# Patient Record
Sex: Male | Born: 1955 | Race: White | Hispanic: No | Marital: Married | Smoking: Never smoker
Health system: Southern US, Community
[De-identification: ages and names within clinical notes are randomized; demographics above are authoritative.]

## PROBLEM LIST (undated history)

## (undated) DIAGNOSIS — K5790 Diverticulosis of intestine, part unspecified, without perforation or abscess without bleeding: Secondary | ICD-10-CM

## (undated) DIAGNOSIS — K219 Gastro-esophageal reflux disease without esophagitis: Secondary | ICD-10-CM

## (undated) DIAGNOSIS — I10 Essential (primary) hypertension: Secondary | ICD-10-CM

## (undated) DIAGNOSIS — E785 Hyperlipidemia, unspecified: Secondary | ICD-10-CM

## (undated) DIAGNOSIS — K529 Noninfective gastroenteritis and colitis, unspecified: Secondary | ICD-10-CM

## (undated) DIAGNOSIS — F329 Major depressive disorder, single episode, unspecified: Secondary | ICD-10-CM

## (undated) DIAGNOSIS — F32A Depression, unspecified: Secondary | ICD-10-CM

## (undated) DIAGNOSIS — E039 Hypothyroidism, unspecified: Secondary | ICD-10-CM

## (undated) HISTORY — PX: CHOLECYSTECTOMY: SHX55

## (undated) HISTORY — PX: COLONOSCOPY: SHX174

## (undated) HISTORY — PX: OTHER SURGICAL HISTORY: SHX169

---
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
## 2019-06-01 ENCOUNTER — Encounter (HOSPITAL_COMMUNITY): Payer: Self-pay | Admitting: Internal Medicine

## 2019-06-01 ENCOUNTER — Inpatient Hospital Stay (HOSPITAL_COMMUNITY): Payer: Commercial Managed Care - PPO

## 2019-06-01 ENCOUNTER — Inpatient Hospital Stay: Payer: Self-pay

## 2019-06-01 ENCOUNTER — Inpatient Hospital Stay (HOSPITAL_COMMUNITY)
Admission: AD | Admit: 2019-06-01 | Discharge: 2019-06-13 | DRG: 071 | Disposition: A | Discharge status: Other Acute Inpatient Hospital | Payer: Commercial Managed Care - PPO | Source: Other Acute Inpatient Hospital | Attending: Internal Medicine | Admitting: Internal Medicine

## 2019-06-01 DIAGNOSIS — F23 Brief psychotic disorder: Secondary | ICD-10-CM | POA: Diagnosis present

## 2019-06-01 DIAGNOSIS — F329 Major depressive disorder, single episode, unspecified: Secondary | ICD-10-CM | POA: Diagnosis present

## 2019-06-01 DIAGNOSIS — R739 Hyperglycemia, unspecified: Secondary | ICD-10-CM | POA: Diagnosis present

## 2019-06-01 DIAGNOSIS — Z8249 Family history of ischemic heart disease and other diseases of the circulatory system: Secondary | ICD-10-CM

## 2019-06-01 DIAGNOSIS — G9341 Metabolic encephalopathy: Principal | ICD-10-CM | POA: Diagnosis present

## 2019-06-01 DIAGNOSIS — Z9049 Acquired absence of other specified parts of digestive tract: Secondary | ICD-10-CM

## 2019-06-01 DIAGNOSIS — F32A Depression, unspecified: Secondary | ICD-10-CM | POA: Diagnosis present

## 2019-06-01 DIAGNOSIS — Z79890 Hormone replacement therapy (postmenopausal): Secondary | ICD-10-CM | POA: Diagnosis not present

## 2019-06-01 DIAGNOSIS — R0902 Hypoxemia: Secondary | ICD-10-CM

## 2019-06-01 DIAGNOSIS — E162 Hypoglycemia, unspecified: Secondary | ICD-10-CM | POA: Diagnosis not present

## 2019-06-01 DIAGNOSIS — E876 Hypokalemia: Secondary | ICD-10-CM | POA: Diagnosis present

## 2019-06-01 DIAGNOSIS — F29 Unspecified psychosis not due to a substance or known physiological condition: Secondary | ICD-10-CM | POA: Diagnosis not present

## 2019-06-01 DIAGNOSIS — B354 Tinea corporis: Secondary | ICD-10-CM | POA: Diagnosis not present

## 2019-06-01 DIAGNOSIS — R52 Pain, unspecified: Secondary | ICD-10-CM

## 2019-06-01 DIAGNOSIS — Z888 Allergy status to other drugs, medicaments and biological substances status: Secondary | ICD-10-CM | POA: Diagnosis not present

## 2019-06-01 DIAGNOSIS — E039 Hypothyroidism, unspecified: Secondary | ICD-10-CM | POA: Diagnosis not present

## 2019-06-01 DIAGNOSIS — E785 Hyperlipidemia, unspecified: Secondary | ICD-10-CM | POA: Diagnosis present

## 2019-06-01 DIAGNOSIS — Z781 Physical restraint status: Secondary | ICD-10-CM | POA: Diagnosis not present

## 2019-06-01 DIAGNOSIS — R1310 Dysphagia, unspecified: Secondary | ICD-10-CM | POA: Diagnosis not present

## 2019-06-01 DIAGNOSIS — K219 Gastro-esophageal reflux disease without esophagitis: Secondary | ICD-10-CM | POA: Diagnosis present

## 2019-06-01 DIAGNOSIS — F321 Major depressive disorder, single episode, moderate: Secondary | ICD-10-CM | POA: Diagnosis not present

## 2019-06-01 DIAGNOSIS — I10 Essential (primary) hypertension: Secondary | ICD-10-CM | POA: Diagnosis not present

## 2019-06-01 DIAGNOSIS — R4182 Altered mental status, unspecified: Secondary | ICD-10-CM | POA: Diagnosis present

## 2019-06-01 HISTORY — DX: Hypothyroidism, unspecified: E03.9

## 2019-06-01 HISTORY — DX: Noninfective gastroenteritis and colitis, unspecified: K52.9

## 2019-06-01 HISTORY — DX: Essential (primary) hypertension: I10

## 2019-06-01 HISTORY — DX: Depression, unspecified: F32.A

## 2019-06-01 HISTORY — DX: Gastro-esophageal reflux disease without esophagitis: K21.9

## 2019-06-01 HISTORY — DX: Diverticulosis of intestine, part unspecified, without perforation or abscess without bleeding: K57.90

## 2019-06-01 HISTORY — DX: Hyperlipidemia, unspecified: E78.5

## 2019-06-01 LAB — GLUCOSE, CAPILLARY
Glucose-Capillary: 74 mg/dL (ref 70–99)
Glucose-Capillary: 65 mg/dL — ABNORMAL LOW (ref 70–99)
Glucose-Capillary: 174 mg/dL — ABNORMAL HIGH (ref 70–99)
Glucose-Capillary: 100 mg/dL — ABNORMAL HIGH (ref 70–99)
Glucose-Capillary: 94 mg/dL (ref 70–99)
Glucose-Capillary: 96 mg/dL (ref 70–99)

## 2019-06-01 LAB — BASIC METABOLIC PANEL
Anion gap: 13 (ref 5–15)
BUN: 7 mg/dL — ABNORMAL LOW (ref 8–23)
CO2: 21 mmol/L — ABNORMAL LOW (ref 22–32)
Calcium: 8.8 mg/dL — ABNORMAL LOW (ref 8.9–10.3)
Chloride: 106 mmol/L (ref 98–111)
Creatinine, Ser: 0.8 mg/dL (ref 0.61–1.24)
GFR calc Af Amer: >60 mL/min (ref >60)
GFR calc non Af Amer: >60 mL/min (ref >60)
Glucose, Bld: 87 mg/dL (ref 70–99)
Potassium: 2.8 mmol/L — ABNORMAL LOW (ref 3.5–5.1)
Sodium: 140 mmol/L (ref 135–145)

## 2019-06-01 LAB — CBC
HCT: 35.6 % — ABNORMAL LOW (ref 39.0–52.0)
Hemoglobin: 12.1 g/dL — ABNORMAL LOW (ref 13.0–17.0)
MCH: 30.8 pg (ref 26.0–34.0)
MCHC: 34 g/dL (ref 30.0–36.0)
MCV: 90.6 fL (ref 80.0–100.0)
Platelets: 209 10*3/uL (ref 150–400)
RBC: 3.93 MIL/uL — ABNORMAL LOW (ref 4.22–5.81)
RDW: 12.3 % (ref 11.5–15.5)
WBC: 9.1 10*3/uL (ref 4.0–10.5)
nRBC: 0 % (ref 0.0–0.2)

## 2019-06-01 LAB — HIV ANTIBODY (ROUTINE TESTING W REFLEX): HIV Screen 4th Generation wRfx: NONREACTIVE

## 2019-06-01 LAB — VITAMIN B12: Vitamin B-12: 331 pg/mL (ref 180–914)

## 2019-06-01 MED ORDER — PROPRANOLOL HCL 20 MG PO TABS
20.0000 mg | ORAL_TABLET | Freq: Two times a day (BID) | ORAL | Status: DC
  Filled 2019-06-01 (×2): qty 1

## 2019-06-01 MED ORDER — HALOPERIDOL LACTATE 5 MG/ML IJ SOLN
1.0000 mg | Freq: Once | INTRAMUSCULAR | Status: AC
  Administered 2019-06-01: 1 mg via INTRAVENOUS
  Filled 2019-06-01: qty 1

## 2019-06-01 MED ORDER — ONDANSETRON HCL 4 MG/2ML IJ SOLN: 4.0000 mg | Freq: Three times a day (TID) | INTRAMUSCULAR | Status: DC | PRN

## 2019-06-01 MED ORDER — AMLODIPINE BESYLATE 5 MG PO TABS: 5.0000 mg | ORAL_TABLET | Freq: Every day | ORAL | Status: DC

## 2019-06-01 MED ORDER — LEVOTHYROXINE SODIUM 50 MCG PO TABS: 50.0000 ug | ORAL_TABLET | Freq: Every day | ORAL | Status: DC

## 2019-06-01 MED ORDER — ATORVASTATIN CALCIUM 10 MG PO TABS: 10.0000 mg | ORAL_TABLET | Freq: Every day | ORAL | Status: DC

## 2019-06-01 MED ORDER — LEVOTHYROXINE SODIUM 100 MCG/5ML IV SOLN
25.0000 ug | Freq: Every day | INTRAVENOUS | Status: DC
  Administered 2019-06-01 – 2019-06-06 (×5): 25 ug via INTRAVENOUS
  Filled 2019-06-01 (×7): qty 5

## 2019-06-01 MED ORDER — CHOLESTYRAMINE 4 G PO PACK: 4.0000 g | PACK | Freq: Every day | ORAL | Status: DC

## 2019-06-01 MED ORDER — SODIUM CHLORIDE 0.9 % IV SOLN
INTRAVENOUS | Status: DC
  Administered 2019-06-01: 04:00:00 via INTRAVENOUS

## 2019-06-01 MED ORDER — METOPROLOL TARTRATE 5 MG/5ML IV SOLN
5.0000 mg | Freq: Three times a day (TID) | INTRAVENOUS | Status: DC
  Administered 2019-06-01: 5 mg via INTRAVENOUS
  Filled 2019-06-01: qty 5

## 2019-06-01 MED ORDER — FAMOTIDINE IN NACL 20-0.9 MG/50ML-% IV SOLN
20.0000 mg | Freq: Two times a day (BID) | INTRAVENOUS | Status: DC
  Administered 2019-06-01 – 2019-06-05 (×9): 20 mg via INTRAVENOUS
  Filled 2019-06-01 (×10): qty 50

## 2019-06-01 MED ORDER — KCL IN DEXTROSE-NACL 40-5-0.9 MEQ/L-%-% IV SOLN
INTRAVENOUS | Status: DC
  Administered 2019-06-01 – 2019-06-04 (×10): via INTRAVENOUS
  Filled 2019-06-01 (×12): qty 1000

## 2019-06-01 MED ORDER — POTASSIUM CHLORIDE 2 MEQ/ML IV SOLN: INTRAVENOUS | Status: DC

## 2019-06-01 MED ORDER — ENOXAPARIN SODIUM 40 MG/0.4ML ~~LOC~~ SOLN
40.0000 mg | SUBCUTANEOUS | Status: DC
  Administered 2019-06-01 – 2019-06-12 (×11): 40 mg via SUBCUTANEOUS
  Filled 2019-06-01 (×10): qty 0.4

## 2019-06-01 MED ORDER — ACETAMINOPHEN 325 MG PO TABS
650.0000 mg | ORAL_TABLET | Freq: Four times a day (QID) | ORAL | Status: DC | PRN
  Filled 2019-06-01 (×2): qty 2

## 2019-06-01 MED ORDER — PANTOPRAZOLE SODIUM 40 MG PO TBEC: 40.0000 mg | DELAYED_RELEASE_TABLET | Freq: Every day | ORAL | Status: DC

## 2019-06-01 MED ORDER — DEXTROSE 50 % IV SOLN
50.0000 mL | INTRAVENOUS | Status: DC | PRN
  Administered 2019-06-01: 50 mL via INTRAVENOUS
  Filled 2019-06-01: qty 50

## 2019-06-01 MED ORDER — LORAZEPAM 1 MG PO TABS: 2.0000 mg | ORAL_TABLET | Freq: Once | ORAL | Status: DC

## 2019-06-01 MED ORDER — LORAZEPAM 2 MG/ML IJ SOLN
2.0000 mg | Freq: Once | INTRAMUSCULAR | Status: AC
  Administered 2019-06-01: 2 mg via INTRAVENOUS
  Filled 2019-06-01: qty 1

## 2019-06-01 MED ORDER — VALPROATE SODIUM 500 MG/5ML IV SOLN
1000.0000 mg | Freq: Once | INTRAVENOUS | Status: AC
  Administered 2019-06-01: 1000 mg via INTRAVENOUS
  Filled 2019-06-01: qty 10

## 2019-06-01 MED ORDER — LOSARTAN POTASSIUM 50 MG PO TABS: 50.0000 mg | ORAL_TABLET | Freq: Every day | ORAL | Status: DC

## 2019-06-01 MED ORDER — POTASSIUM CHLORIDE 10 MEQ/100ML IV SOLN
10.0000 meq | INTRAVENOUS | Status: AC
  Administered 2019-06-01 (×5): 10 meq via INTRAVENOUS
  Filled 2019-06-01 (×5): qty 100

## 2019-06-01 MED ORDER — HYDRALAZINE HCL 20 MG/ML IJ SOLN: 5.0000 mg | INTRAMUSCULAR | Status: DC | PRN

## 2019-06-01 NOTE — Procedures (Signed)
Patient Name: Michael Espinoza  MRN: [id number]  Epilepsy Attending: Lora Havens  Referring Physician/Provider:   Date: 06/05/2019 Duration: 31.06  Patient history: 63 year old male with altered mental status.  EEG to evaluate for seizures.  Level of alertness: Sleep  Technical aspects: This EEG study was done with scalp electrodes positioned according to the 10-20 International system of electrode placement. Electrical activity was acquired at a sampling rate of 500Hz  and reviewed with a high frequency filter of 70Hz  and a low frequency filter of 1Hz . EEG data were recorded continuously and digitally stored.   Description: Patient was asleep during the whole recording.  Sleep was characterized by vertex waves, sleep spindles (12 to 14 Hz) maximal bifrontal central region.  There was also continuous generalized 3 to 5 Hz theta-delta slowing.  Ventilation and photic stimulation were not performed.  IMPRESSION: This study recorded during sleep only is suggestive of mild to moderate diffuse encephalopathy.  No seizures or epileptiform discharges were seen throughout the recording.   Michael Espinoza [name]

## 2019-06-01 NOTE — Progress Notes (Signed)
Updated the restraint order per verbal order from Dr. Doristine Bosworth to reflect the actual type ordered.
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
## 2019-06-01 NOTE — Progress Notes (Signed)
I spoke with pt's wife, Pamala Hurry and gave her an update on patient and visitation policies.

## 2019-06-01 NOTE — Progress Notes (Signed)
Initial Nutrition Assessment  DOCUMENTATION CODES:   Not applicable  INTERVENTION:   -RD will follow for diet advancement and supplement diet as appropriate  NUTRITION DIAGNOSIS:   Inadequate oral intake related to inability to eat as evidenced by NPO status.  GOAL:   Patient will meet greater than or equal to 90% of their needs  MONITOR:   Diet advancement, Labs, Weight trends, Skin, I & O's  REASON FOR ASSESSMENT:   Low Braden    ASSESSMENT:   Michael Espinoza is a 63 y.o. male with medical history significant of former Xanax abuse, hypertension, hyperlipidemia, GERD, hypothyroidism, depression, colitis, who presents with altered mental status.  Pt admitted with AMS.   Reviewed I/O's: +600 ml x 24 hours  Pt unable to provide further nutrition-related to somnolence. He did not respond to touch or voice.   Reviewed records from [facility]; noted ht recorded at 67.5" and recorded wt at 159#. RD also obtained weight via bedscale. Unsure of accuracy of weights.   IV: dextrose 5% and 0.9% NaCl with KCl 40 mEq/L @ 200 ml/hr  Labs reviewed: K: 2.8 (on IV supplementation), CBGS: 65-74.   NUTRITION - FOCUSED PHYSICAL EXAM:    Most Recent Value  Orbital Region  Moderate depletion  Upper Arm Region  No depletion  Thoracic and Lumbar Region  No depletion  Buccal Region  Mild depletion  Temple Region  Mild depletion  Clavicle Bone Region  No depletion  Clavicle and Acromion Bone Region  No depletion  Scapular Bone Region  No depletion  Dorsal Hand  Unable to assess  Patellar Region  No depletion  Anterior Thigh Region  No depletion  Posterior Calf Region  No depletion  Edema (RD Assessment)  None  Hair  Reviewed  Eyes  Reviewed  Mouth  Reviewed  Skin  Reviewed  Nails  Reviewed       Diet Order:   Diet Order            Diet NPO time specified Except for: Sips with Meds, Ice Chips  Diet effective now              EDUCATION NEEDS:   Not  appropriate for education at this time  Skin:  Skin Assessment: Reviewed RN Assessment  Last BM:  Unknown  Height:   Ht Readings from Last 1 Encounters:  06/01/19 5' 7.5" (1.715 m)    Weight:   Wt Readings from Last 1 Encounters:  06/01/19 65.8 kg    Ideal Body Weight:  68.6 kg  BMI:  Body mass index is 22.38 kg/m.  Estimated Nutritional Needs:   Kcal:  5361-4431  Protein:  85-100 grams  Fluid:  1.7-1.9 L    Keshara Kiger A. Jimmye Norman, RD, LDN, [place] Registered Dietitian II Certified Diabetes Care and Education Specialist Pager: [phone] After hours Pager: [phone]
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
## 2019-06-01 NOTE — Progress Notes (Signed)
EEG complete - results pending 

## 2019-06-01 NOTE — Plan of Care (Signed)

## 2019-06-01 NOTE — Progress Notes (Addendum)
Subjective: Patient received ativan/depakote overnight, now very sedated.   Exam: Vitals:   06/01/19 0751 06/01/19 0904  BP:  95/66  Pulse:  81  Resp:  14  Temp: 98.4 F (36.9 C)   SpO2:  (!) 87%   Gen: In bed, NAD Resp: non-labored breathing, no acute distress Abd: soft, nt  Neuro: MS: does not open eyes, or follow commands.  PR:XYVOP, face symmetric Motor: withdraws x 4 Sensory:as above.   Pertinent Labs: Bmp - unremarkable  Impression: 63 yo M with dementia with acute delirium starting after cyclobenzaprine starting. I suspect he has been slow to clear and now represents essentially a hospitalization related delirium. With how sedated he is now, I do think an EEG would be reasonable, but low suspicion for seizures.   Recommendations: 1) EEG    Roland Rack, MD [facility] [phone]  If 7pm- 7am, please page neurology on call as listed in [place].

## 2019-06-01 NOTE — Progress Notes (Signed)
Pt is currently chemically sedated with Ativan and Haldol per MD order.  Pt is also in 4 point restraints and mittens also per md order.  Unit director and charge RN are aware of situation. Remote critical care monitoring also aware of situation.  Pt is sedated with unlabored respirations and is on Progressive care monitor. No house sitters are available to sit with patient at this time.

## 2019-06-01 NOTE — Progress Notes (Signed)
PROGRESS NOTE    Michael Espinoza  QMV:[id number] DOB: 06/15/1956 DOA: 06/01/2019 PCP: Roanna RaiderBuno, [name], PA   Brief Narrative:  Michael Espinoza is a 63 y.o. male with medical history significant of former Xanax abuse, hypertension, hyperlipidemia, GERD, hypothyroidism, depression, colitis, who presented to [facility] with altered mental status, hallucinations and agitation on 05/29/2019.  Per report, patient had a history of Xanax addiction that was tapered and discontinued 2014, and has since been clean. Recently due to lower back pain, patient was seeing chiropractor.  He has been taking Flexeril initially 10 mg 3 times per day, but the patient admitted that that he has been taking up to 6 pills/day.  Since then he was noted by his wife to be confused, with hallucination and agitation.  Patient stopped taking Flexeril, but his mental status has not improved.   No history of alcohol abuse, recreational drug or illicit drug use.  No unilateral weakness, numbness in extremities.  No facial droop or slurred speech.  No chest pain, shortness breath, cough.  No nausea vomiting, diarrhea, abdominal pain, symptoms of UTI.  Pt was treated with prn zypexa and Geodon in another hospital. CT head and MRI brain had no acute findings. Urine drug screen is negative. Patient had negative urinalysis, vitamin B 12 level 462 which is normal, negative COVID-19 test, negative chest x-ray.  Due to lack of neurology and psychiatry services, [facility].  Neurology was consulted.    Subjective: Patient seen and examined.  He is sedated and in restraints but he seems to be protecting his airway.  Breathing through mouth.  Assessment & Plan:   Principal Problem:   Acute metabolic encephalopathy Active Problems:   Hypertension   Hypothyroidism   HLD (hyperlipidemia)   GERD (gastroesophageal reflux disease)   Depression   Acute metabolic  encephalopathy/hallucinations: Likely due to Flexeril withdrawal the patient with possible underlying dementia.  Had negative CT head and MRI.  Neurology on board.  Plan for further imaging studies here.  Will wait for him to wake up more and continue restraints to prevent injuries.  We will keep him n.p.o.  High risk of aspiration.  Monitor closely.  Hypertension: Reportedly he is on Cozaar, amlodipine and propranolol at home.  He was switched to IV Lopressor scheduled here and PRN hydralazine.  His blood pressure is low.  I will discontinue scheduled metoprolol but continue PRN hydralazine.  Will increase frequency of IV fluids.  Hypothyroidism: Continue Synthroid.  Hyperlipidemia: Hold Lipitor until able to take p.o.  GERD: Continue IV Pepcid.  Hypokalemia: We will replace through IV.  Hyperglycemia: We will see fluids from normal saline to dextrose normal saline with potassium chloride.  DVT prophylaxis: Lovenox Code Status: Full code Family Communication: None present Disposition Plan: TBD  Objective: Vitals:   06/01/19 0523 06/01/19 0750 06/01/19 0751 06/01/19 0904  BP: 98/73 (!) 109/92  95/66  Pulse: 96 81  81  Resp:  18  14  Temp:   98.4 F (36.9 C)   TempSrc:   Axillary   SpO2: 92% 97%  (!) 87%    Intake/Output Summary (Last 24 hours) at 06/01/2019 0943 Last data filed at 06/01/2019 0600 Gross per 24 hour  Intake 600 ml  Output -  Net 600 ml   There were no vitals filed for this visit.  Consultants:   Neurology  Procedures:   None  Antimicrobials:   None  Objective: Vitals:   06/01/19 0523  06/01/19 0750 06/01/19 0751 06/01/19 0904  BP: 98/73 (!) 109/92  95/66  Pulse: 96 81  81  Resp:  18  14  Temp:   98.4 F (36.9 C)   TempSrc:   Axillary   SpO2: 92% 97%  (!) 87%    Intake/Output Summary (Last 24 hours) at 06/01/2019 0943 Last data filed at 06/01/2019 0600 Gross per 24 hour  Intake 600 ml  Output -  Net 600 ml   There were no vitals filed for  this visit.  Examination:  General exam: Chemically sedated and on restraints. Respiratory system: Clear to auscultation. Respiratory effort normal.  Mouth breather. Cardiovascular system: S1 & S2 heard, RRR. No JVD, murmurs, rubs, gallops or clicks. No pedal edema. Gastrointestinal system: Abdomen is nondistended, soft and nontender. No organomegaly or masses felt. Normal bowel sounds heard. Central nervous system: Sedated. Extremities: Moving all extremities spontaneously. Skin: No rashes, lesions or ulcers Psychiatry: Unable to assess due to sedation.   Data Reviewed: I have personally reviewed following labs and imaging studies  CBC: Recent Labs  Lab 06/01/19 0442  WBC 9.1  HGB 12.1*  HCT 35.6*  MCV 90.6  PLT 542   Basic Metabolic Panel: Recent Labs  Lab 06/01/19 0442  NA 140  K 2.8*  CL 106  CO2 21*  GLUCOSE 87  BUN 7*  CREATININE 0.80  CALCIUM 8.8*   GFR: CrCl cannot be calculated (Unknown ideal weight.). Liver Function Tests: No results for input(s): AST, ALT, ALKPHOS, BILITOT, PROT, ALBUMIN in the last 168 hours. No results for input(s): LIPASE, AMYLASE in the last 168 hours. No results for input(s): AMMONIA in the last 168 hours. Coagulation Profile: No results for input(s): INR, PROTIME in the last 168 hours. Cardiac Enzymes: No results for input(s): CKTOTAL, CKMB, CKMBINDEX, TROPONINI in the last 168 hours. BNP (last 3 results) No results for input(s): PROBNP in the last 8760 hours. HbA1C: No results for input(s): HGBA1C in the last 72 hours. CBG: Recent Labs  Lab 06/01/19 0415 06/01/19 0820 06/01/19 0906  GLUCAP 74 65* 174*   Lipid Profile: No results for input(s): CHOL, HDL, LDLCALC, TRIG, CHOLHDL, LDLDIRECT in the last 72 hours. Thyroid Function Tests: No results for input(s): TSH, T4TOTAL, FREET4, T3FREE, THYROIDAB in the last 72 hours. Anemia Panel: No results for input(s): VITAMINB12, FOLATE, FERRITIN, TIBC, IRON, RETICCTPCT in the  last 72 hours. Sepsis Labs: No results for input(s): PROCALCITON, LATICACIDVEN in the last 168 hours.  No results found for this or any previous visit (from the past 240 hour(s)).    Radiology Studies: No results found.  Scheduled Meds: . enoxaparin (LOVENOX) injection  40 mg Subcutaneous Q24H  . levothyroxine  25 mcg Intravenous Daily  . metoprolol tartrate  5 mg Intravenous Q8H   Continuous Infusions: . dextrose 5 % and 0.9% NaCl 1,000 mL with potassium chloride 40 mEq infusion    . famotidine (PEPCID) IV       LOS: 0 days   Time spent: 35 minutes   Darliss Cheney, MD [facility] Pager [phone]  If 7PM-7AM, please contact night-coverage [email or web address] Password [facility] 06/01/2019, 9:43 AM

## 2019-06-01 NOTE — H&P (Addendum)
History and Physical    Michael CopasConnie Espinoza WUJ:[id number] DOB: 04/27/1956 DOA: 06/01/2019  Referring MD/NP/PA:   PCP: Roanna RaiderBuno, [name], PA   Patient coming from:  The patient is coming from home.  At baseline, pt is independent for most of ADL.        Chief Complaint: AMS  HPI: Michael CopasConnie Espinoza is a 63 y.o. male with medical history significant of former Xanax abuse, hypertension, hyperlipidemia, GERD, hypothyroidism, depression, colitis, who presents with altered mental status.  [facility]  [facility] due to altered mental status, agitation and hallucination on 8/4. Per report, patient had a history of Xanax addiction that was tapered and discontinued 2014, and has since been clean. Recently due to lower back pain, patient was seeing chiropractor.  He has been taking Flexeril initially 10 mg 3 times per day, but the patient admitted that that he has been taking up to 6 pills/day.  Since then he was noted by his wife to be confused, with hallucination and agitation.  Patient stopped taking Flexeril, but his mental status has not improved. Patient denies alcohol abuse, recreational drug or illicit drug use.  No unilateral weakness, numbness in extremities.  No facial droop or slurred speech.  No chest pain, shortness breath, cough.  No nausea vomiting, diarrhea, abdominal pain, symptoms of UTI.  Pt was treated with prn zypexa and Geodon in another hospital. CT head and MRI brain had no acute findings. Urine drug screen is negative. Patient had negative urinalysis, vitamin B 12 level 462 which is normal, negative COVID-19 test, negative chest x-ray. When I saw pt on the floor, he is confused, knows his own name, not oriented to the place and time.  He is agitated, confused, hallucinating.  He moves all extremities normally.  No facial droop or slurred speech.  They have discussed with neurology Dr. Amada JupiterKirkpatrick and pt is  [facility] Dr. Laurence SlateAroor of neuro evaluated pt on the floor.  Review of Systems: Could not reviewed due to altered mental status.  Allergy:  Allergies  Allergen Reactions  . Lexapro [Escitalopram]     Severe hallucination    Past Medical History:  Diagnosis Date  . Colitis   . Depression   . Diverticulosis   . GERD (gastroesophageal reflux disease)   . HLD (hyperlipidemia)   . Hypertension   . Hypothyroidism     Past Surgical History:  Procedure Laterality Date  . CHOLECYSTECTOMY    . COLONOSCOPY    . Right ankle surgery      Social History:  reports that he has never smoked. He has never used smokeless tobacco. He reports previous alcohol use. He reports current drug use. Drug: Benzodiazepines.  Family History:  Family History  Problem Relation Age of Onset  . Dementia Mother   . Hypertension Father      Prior to Admission medications   Not on File    Physical Exam: Vitals:   06/01/19 0124  BP: (!) 140/94  Pulse: (!) 110  Temp: 98.6 F (37 C)  TempSrc: Oral  SpO2: 95%   General: Not in acute distress HEENT:       Eyes: PERRL, EOMI, no scleral icterus.       ENT: No discharge from the ears and nose.       Neck: No JVD, no bruit, no mass felt. Heme: No neck lymph node enlargement. Cardiac: S1/S2, RRR, No murmurs, No gallops or rubs.  Respiratory: No rales, wheezing, rhonchi or rubs. GI: Soft, nondistended, nontender,  no organomegaly, BS present. GU: No hematuria Ext: No pitting leg edema bilaterally. 2+DP/PT pulse bilaterally. Musculoskeletal: No joint deformities, No joint redness or warmth, no limitation of ROM in spin. Skin: No rashes.  Neuro: Confused, agitated, hallucinating, not oriented X3, cranial nerves II-XII grossly intact, moves all extremities Psych: Has hallucination  Labs on Admission: I have personally reviewed following labs and imaging studies  CBC: No results for input(s): WBC, NEUTROABS, HGB, HCT, MCV, PLT in the last 168  hours. Basic Metabolic Panel: No results for input(s): NA, K, CL, CO2, GLUCOSE, BUN, CREATININE, CALCIUM, MG, PHOS in the last 168 hours. GFR: CrCl cannot be calculated (No successful lab value found.). Liver Function Tests: No results for input(s): AST, ALT, ALKPHOS, BILITOT, PROT, ALBUMIN in the last 168 hours. No results for input(s): LIPASE, AMYLASE in the last 168 hours. No results for input(s): AMMONIA in the last 168 hours. Coagulation Profile: No results for input(s): INR, PROTIME in the last 168 hours. Cardiac Enzymes: No results for input(s): CKTOTAL, CKMB, CKMBINDEX, TROPONINI in the last 168 hours. BNP (last 3 results) No results for input(s): PROBNP in the last 8760 hours. HbA1C: No results for input(s): HGBA1C in the last 72 hours. CBG: No results for input(s): GLUCAP in the last 168 hours. Lipid Profile: No results for input(s): CHOL, HDL, LDLCALC, TRIG, CHOLHDL, LDLDIRECT in the last 72 hours. Thyroid Function Tests: No results for input(s): TSH, T4TOTAL, FREET4, T3FREE, THYROIDAB in the last 72 hours. Anemia Panel: No results for input(s): VITAMINB12, FOLATE, FERRITIN, TIBC, IRON, RETICCTPCT in the last 72 hours. Urine analysis: No results found for: COLORURINE, APPEARANCEUR, LABSPEC, PHURINE, GLUCOSEU, HGBUR, BILIRUBINUR, KETONESUR, PROTEINUR, UROBILINOGEN, NITRITE, LEUKOCYTESUR Sepsis Labs: @LABRCNTIP (procalcitonin:4,lacticidven:4) )No results found for this or any previous visit (from the past 240 hour(s)).   Radiological Exams on Admission: No results found.   EKG: will get one.   Assessment/Plan Principal Problem:   Acute metabolic encephalopathy Active Problems:   Hypertension   Hypothyroidism   HLD (hyperlipidemia)   GERD (gastroesophageal reflux disease)   Depression   Acute metabolic encephalopathy: pt has hallucination, confusion, agitation.  Etiology is not clear.  Patient had a negative CT head, negative MRI for brain, negative UDS screen,  vitamin B12 normal level.  Neurology was consulted, Dr. Laurence SlateAroor evaluated patient. Per Dr. Laurence SlateAroor, will give 1 g of valproic acid and 2 mg of Ativan, should avoid anticholinergic and antipsychotic medications from now"  -will admit to SDU as inpt -Frequent neuro check -f/u neurologsit's recommendation -keep pt NPO until mental status improves -hold all oral meds now -on four-point restraints.  Hypertension: Blood pressure 140/94.  Patient was on Cozaar, amlodipine, propranolol at home. -IV hydralazine as needed -IV metoprolol 5 mg q8h with holding parameters for SBP<95 or HR<65  Hypothyroidism: -Continue Synthroid -->change it to IV, cut dose from 50 to 25 mcg daily  HLD (hyperlipidemia): -hold Lipitor  GERD (gastroesophageal reflux disease): -hold Protonix -IV pepcid  Depression: Not taking medications -Observe closely    Inpatient status:  # Patient requires inpatient status due to high intensity of service, high risk for further deterioration and high frequency of surveillance required.  I certify that at the point of admission it is my clinical judgment that the patient will require inpatient hospital care spanning beyond 2 midnights from the point of admission.  . This patient has multiple chronic comorbidities including former Xanax abuse, hypertension, hyperlipidemia, GERD, hypothyroidism, depression, colitis. .Marland Kitchen  Now patient has presenting with altered mental status, agitation, hallucination and confusion without clear etiology . The worrisome physical exam findings include confusion, agitation and hallucination . Current medical needs: please see my assessment and plan . Predictability of an adverse outcome (risk): Patient has multiple comorbidities, now presents with altered mental status with agitation, hallucination and confusion without clear etiology.  Patient was treated in another hospital without improvement. Patient will need further work-up.  If etiology is not  identified, patient is at high risk for deteriorating.  Will need to be treated in hospital for at least 2 days.     DVT ppx: SQ Lovenox Code Status: Full code Family Communication: None at bed side.   Disposition Plan:  Anticipate discharge back to previous home environment Consults called:  Dr. Lorraine Lax Admission status: SDU/inpation       Date of Service 06/01/2019    [place] [facility]   If 7PM-7AM, please contact night-coverage [email or web address] Password [email or web address] 06/01/2019, 2:40 AM

## 2019-06-02 ENCOUNTER — Inpatient Hospital Stay (HOSPITAL_COMMUNITY): Payer: Commercial Managed Care - PPO

## 2019-06-02 LAB — COMPREHENSIVE METABOLIC PANEL
ALT: 28 U/L (ref 0–44)
AST: 35 U/L (ref 15–41)
Albumin: 3.5 g/dL (ref 3.5–5.0)
Alkaline Phosphatase: 79 U/L (ref 38–126)
Anion gap: 10 (ref 5–15)
BUN: <5 mg/dL — ABNORMAL LOW (ref 8–23)
CO2: 22 mmol/L (ref 22–32)
Calcium: 8.9 mg/dL (ref 8.9–10.3)
Chloride: 110 mmol/L (ref 98–111)
Creatinine, Ser: 0.58 mg/dL — ABNORMAL LOW (ref 0.61–1.24)
GFR calc Af Amer: >60 mL/min (ref >60)
GFR calc non Af Amer: >60 mL/min (ref >60)
Glucose, Bld: 116 mg/dL — ABNORMAL HIGH (ref 70–99)
Potassium: 3.3 mmol/L — ABNORMAL LOW (ref 3.5–5.1)
Sodium: 142 mmol/L (ref 135–145)
Total Bilirubin: 2.1 mg/dL — ABNORMAL HIGH (ref 0.3–1.2)
Total Protein: 5.8 g/dL — ABNORMAL LOW (ref 6.5–8.1)

## 2019-06-02 LAB — CBC WITH DIFFERENTIAL/PLATELET
Abs Immature Granulocytes: 0.02 10*3/uL (ref 0.00–0.07)
Basophils Absolute: 0 10*3/uL (ref 0.0–0.1)
Basophils Relative: 0 %
Eosinophils Absolute: 0.1 10*3/uL (ref 0.0–0.5)
Eosinophils Relative: 2 %
HCT: 33.3 % — ABNORMAL LOW (ref 39.0–52.0)
Hemoglobin: 11.5 g/dL — ABNORMAL LOW (ref 13.0–17.0)
Immature Granulocytes: 0 %
Lymphocytes Relative: 12 %
Lymphs Abs: 0.8 10*3/uL (ref 0.7–4.0)
MCH: 31.1 pg (ref 26.0–34.0)
MCHC: 34.5 g/dL (ref 30.0–36.0)
MCV: 90 fL (ref 80.0–100.0)
Monocytes Absolute: 0.7 10*3/uL (ref 0.1–1.0)
Monocytes Relative: 11 %
Neutro Abs: 5.1 10*3/uL (ref 1.7–7.7)
Neutrophils Relative %: 75 %
Platelets: 193 10*3/uL (ref 150–400)
RBC: 3.7 MIL/uL — ABNORMAL LOW (ref 4.22–5.81)
RDW: 12.3 % (ref 11.5–15.5)
WBC: 6.7 10*3/uL (ref 4.0–10.5)
nRBC: 0 % (ref 0.0–0.2)

## 2019-06-02 LAB — GLUCOSE, CAPILLARY
Glucose-Capillary: 100 mg/dL — ABNORMAL HIGH (ref 70–99)
Glucose-Capillary: 102 mg/dL — ABNORMAL HIGH (ref 70–99)
Glucose-Capillary: 109 mg/dL — ABNORMAL HIGH (ref 70–99)
Glucose-Capillary: 110 mg/dL — ABNORMAL HIGH (ref 70–99)
Glucose-Capillary: 114 mg/dL — ABNORMAL HIGH (ref 70–99)
Glucose-Capillary: 115 mg/dL — ABNORMAL HIGH (ref 70–99)

## 2019-06-02 LAB — PROCALCITONIN: Procalcitonin: <0.1 ng/mL

## 2019-06-02 LAB — MAGNESIUM: Magnesium: 1.7 mg/dL (ref 1.7–2.4)

## 2019-06-02 MED ORDER — CYANOCOBALAMIN 1000 MCG/ML IJ SOLN
1000.0000 ug | Freq: Once | INTRAMUSCULAR | Status: AC
  Administered 2019-06-02: 1000 ug via INTRAMUSCULAR
  Filled 2019-06-02: qty 1

## 2019-06-02 MED ORDER — POTASSIUM CHLORIDE 10 MEQ/100ML IV SOLN
10.0000 meq | INTRAVENOUS | Status: AC
  Administered 2019-06-02 (×5): 10 meq via INTRAVENOUS
  Filled 2019-06-02 (×5): qty 100

## 2019-06-02 MED ORDER — QUETIAPINE FUMARATE 25 MG PO TABS
25.0000 mg | ORAL_TABLET | Freq: Every day | ORAL | Status: DC
  Administered 2019-06-03: 25 mg via ORAL
  Filled 2019-06-02 (×2): qty 1

## 2019-06-02 NOTE — Progress Notes (Signed)
PROGRESS NOTE    Carry Ortez  EXH:[id number] DOB: Mar 01, 1956 DOA: 06/01/2019 PCP: Ramond Craver, PA   Brief Narrative:  Michael Espinoza is a 63 y.o. male with medical history significant of former Xanax abuse, hypertension, hyperlipidemia, GERD, hypothyroidism, depression, colitis, who presented to OSH [facility]) with altered mental status, hallucinations and agitation on 05/29/2019.  Per report, patient had a history of Xanax addiction that was tapered and discontinued 2014, and has since been clean. Recently due to lower back pain, patient was seeing chiropractor.  He has been taking Flexeril initially 10 mg 3 times per day, but the patient admitted that that he has been taking up to 6 pills/day.  Since then he was noted by his wife to be confused, with hallucination and agitation.  Patient stopped taking Flexeril, but his mental status has not improved.   No history of alcohol abuse, recreational drug or illicit drug use.  No unilateral weakness, numbness in extremities.  No facial droop or slurred speech.  No chest pain, shortness breath, cough.  No nausea vomiting, diarrhea, abdominal pain, symptoms of UTI.  Pt was treated with prn zypexa and Geodon in another hospital. CT head and MRI brain had no acute findings. Urine drug screen is negative. Patient had negative urinalysis, vitamin B 12 level 462 which is normal, negative COVID-19 test, negative chest x-ray.  Due to lack of neurology and psychiatry services, patient was transferred to [facility].  Neurology was consulted.    Subjective: Patient seen and examined.  He is much more awake than yesterday.  He is trying and is able to talk however he has some dysarthria, likely secondary to lethargy.  He was calm.  He was able to follow simple commands.  Assessment & Plan:   Principal Problem:   Acute metabolic encephalopathy Active Problems:   Hypertension   Hypothyroidism   HLD (hyperlipidemia)   GERD  (gastroesophageal reflux disease)   Depression   Acute metabolic encephalopathy/hallucinations: Likely due to Flexeril withdrawal with possible underlying dementia.  Had negative CT head and MRI.  Neurology on board.  Started on Seroquel today.  Hypertension: Reportedly he is on Cozaar, amlodipine and propranolol at home.  Blood pressure stable despite of being off of the medications.  Continue monitor with PRN hydralazine.  Hypothyroidism: Continue Synthroid.  Hyperlipidemia: Hold Lipitor until able to take p.o.  GERD: Continue IV Pepcid.  Hypokalemia: 3.3.  Replace more through IV.  Hypoglycemia: Blood sugar control now.  Continue dextrose with IV fluids with n.p.o.  Speech to see.  DVT prophylaxis: Lovenox Code Status: Full code Family Communication: Called and discussed with his wife Michael Espinoza and updated her.  Answered several questions.  She was grateful for the call. Disposition Plan: TBD  Objective: Vitals:   06/02/19 0751 06/02/19 0900 06/02/19 1141 06/02/19 1300  BP: (!) 154/87 (!) 154/87 137/80 (!) 140/103  Pulse: (!) 101   (!) 120  Resp: 11   16  Temp: 98.9 F (37.2 C)   98.3 F (36.8 C)  TempSrc: Axillary   Axillary  SpO2: 96%   97%  Weight:      Height:        Intake/Output Summary (Last 24 hours) at 06/02/2019 1323 Last data filed at 06/02/2019 1100 Gross per 24 hour  Intake 1626.06 ml  Output 2050 ml  Net -423.94 ml   Filed Weights   06/01/19 1041 06/02/19 0352  Weight: 65.8 kg 67 kg    Consultants:   Neurology  Procedures:   None  Antimicrobials:   None  Objective: Vitals:   06/02/19 0751 06/02/19 0900 06/02/19 1141 06/02/19 1300  BP: (!) 154/87 (!) 154/87 137/80 (!) 140/103  Pulse: (!) 101   (!) 120  Resp: 11   16  Temp: 98.9 F (37.2 C)   98.3 F (36.8 C)  TempSrc: Axillary   Axillary  SpO2: 96%   97%  Weight:      Height:        Intake/Output Summary (Last 24 hours) at 06/02/2019 1323 Last data filed at 06/02/2019 1100 Gross per  24 hour  Intake 1626.06 ml  Output 2050 ml  Net -423.94 ml   Filed Weights   06/01/19 1041 06/02/19 0352  Weight: 65.8 kg 67 kg    Examination:  General exam: Appears calm and comfortable  Respiratory system: Clear to auscultation. Respiratory effort normal. Cardiovascular system: S1 & S2 heard, RRR. No JVD, murmurs, rubs, gallops or clicks. No pedal edema. Gastrointestinal system: Abdomen is nondistended, soft and nontender. No organomegaly or masses felt. Normal bowel sounds heard. Central nervous system: Alert and oriented x0. No focal neurological deficits. Extremities: Symmetric 5 x 5 power. Skin: No rashes, lesions or ulcers Psychiatry: Unable to assess judgment due to lethargy.   Data Reviewed: I have personally reviewed following labs and imaging studies  CBC: Recent Labs  Lab 06/01/19 0442 06/02/19 0745  WBC 9.1 6.7  NEUTROABS  --  5.1  HGB 12.1* 11.5*  HCT 35.6* 33.3*  MCV 90.6 90.0  PLT 209 193   Basic Metabolic Panel: Recent Labs  Lab 06/01/19 0442 06/02/19 0745  NA 140 142  K 2.8* 3.3*  CL 106 110  CO2 21* 22  GLUCOSE 87 116*  BUN 7* <5*  CREATININE 0.80 0.58*  CALCIUM 8.8* 8.9  MG  --  1.7   GFR: Estimated Creatinine Clearance: 88.4 mL/min (A) (by C-G formula based on SCr of 0.58 mg/dL (L)). Liver Function Tests: Recent Labs  Lab 06/02/19 0745  AST 35  ALT 28  ALKPHOS 79  BILITOT 2.1*  PROT 5.8*  ALBUMIN 3.5   No results for input(s): LIPASE, AMYLASE in the last 168 hours. No results for input(s): AMMONIA in the last 168 hours. Coagulation Profile: No results for input(s): INR, PROTIME in the last 168 hours. Cardiac Enzymes: No results for input(s): CKTOTAL, CKMB, CKMBINDEX, TROPONINI in the last 168 hours. BNP (last 3 results) No results for input(s): PROBNP in the last 8760 hours. HbA1C: No results for input(s): HGBA1C in the last 72 hours. CBG: Recent Labs  Lab 06/01/19 1951 06/02/19 0027 06/02/19 0405 06/02/19 0821  06/02/19 1310  GLUCAP 96 110* 102* 114* 115*   Lipid Profile: No results for input(s): CHOL, HDL, LDLCALC, TRIG, CHOLHDL, LDLDIRECT in the last 72 hours. Thyroid Function Tests: No results for input(s): TSH, T4TOTAL, FREET4, T3FREE, THYROIDAB in the last 72 hours. Anemia Panel: Recent Labs    06/01/19 1211  VITAMINB12 331   Sepsis Labs: Recent Labs  Lab 06/02/19 0745  PROCALCITON <0.10    No results found for this or any previous visit (from the past 240 hour(s)).    Radiology Studies: Dg Chest Port 1 View  Result Date: 06/02/2019 CLINICAL DATA:  Altered mental status EXAM: PORTABLE CHEST 1 VIEW COMPARISON:  A 420 FINDINGS: Midline trachea.  Normal heart size and mediastinal contours. Sharp costophrenic angles.  No pneumothorax.  Clear lungs. Numerous leads and wires project over the chest. IMPRESSION: No active disease. Electronically  Signed   By: Jeronimo GreavesKyle  Talbot M.D.   On: 06/02/2019 10:21    Scheduled Meds: . enoxaparin (LOVENOX) injection  40 mg Subcutaneous Q24H  . levothyroxine  25 mcg Intravenous Daily   Continuous Infusions: . dextrose 5 % and 0.9 % NaCl with KCl 40 mEq/L 125 mL/hr at 06/02/19 0939  . famotidine (PEPCID) IV 20 mg (06/02/19 0947)  . potassium chloride       LOS: 1 day   Time spent: 30 minutes   Hughie Clossavi Ruwayda Curet, MD [facility] Pager [phone]  If 7PM-7AM, please contact night-coverage [email or web address] Password [email or web address] 06/02/2019, 1:23 PM

## 2019-06-02 NOTE — Progress Notes (Signed)
I spoke to patient's wife, Pamala Hurry. I told her Dr. Doristine Bosworth would try to call her today and that EEG was normal.  Pt is waking up and we want to try to keep him awake today to restart a normal sleep cycle.  Pamala Hurry said she would try to find a ride and stay and visit for a while today.

## 2019-06-02 NOTE — Progress Notes (Signed)
Subjective: Patient is improved, but very dysarthric.   Exam: Vitals:   06/02/19 0725 06/02/19 0900  BP:  (!) 154/87  Pulse:    Resp:    Temp: 98.9 F (37.2 C)   SpO2:     Gen: In bed, NAD Resp: non-labored breathing, no acute distress Abd: soft, nt  Neuro: MS: Awakens easily, follows commands readily, very dysarthric but does answer some simple questions VA:NVBTY, face symmetric Motor: He moves all extremities relatively equally Sensory: Endorses symmetric sensation  Pertinent Labs: B12 330 B1-pending Procalcitonin-negative CMP-elevated total bilirubin   Impression: 63 yo M with dementia with acute delirium starting after cyclobenzaprine starting. I suspect he has been slow to clear and now represents essentially a hospitalization related delirium.  With normal MRI and EEG, I have low suspicion for other etiologies.  I suspect that his continued dysarthria.  With slow clearing, I do think evaluating for other causes such as hyperammonemia with thyroid dysfunction will be prudent, B12 is borderline low and therefore I will send a methylmalonic acid and give a single dose of B12, if the methylmalonic acid returns high, then this will need to be continued  Recommendations: 1) ammonia, TSH 2) B12 injection, MMA 3) we will try and avoid sedatives in general, could consider 25 mg nightly of Seroquel to help reestablish sleep-wake cycle.  Roland Rack, MD [facility] [phone]  If 7pm- 7am, please page neurology on call as listed in [place].

## 2019-06-02 NOTE — Evaluation (Signed)
\Clinical/Bedside Swallow Evaluation Patient Details  Name: Michael Espinoza MRN: [id number] Date of Birth: Feb 08, 1956  Today's Date: 06/02/2019 Time: SLP Start Time (ACUTE ONLY): 1410 SLP Stop Time (ACUTE ONLY): 1430 SLP Time Calculation (min) (ACUTE ONLY): 20 min  Past Medical History:  Past Medical History:  Diagnosis Date  . Colitis   . Depression   . Diverticulosis   . GERD (gastroesophageal reflux disease)   . HLD (hyperlipidemia)   . Hypertension   . Hypothyroidism    Past Surgical History:  Past Surgical History:  Procedure Laterality Date  . CHOLECYSTECTOMY    . COLONOSCOPY    . Right ankle surgery     HPI:  Patient is a 63 y.o. male with PMH: former Xanax abuse, HTN, HLD, GERD, hypothyroidism, depression, colitis, who presented to teh hospital with AMS. He has recently been taking Flexeril for lower back pain and although prescription was for 10 mg 3 times a day, patient had admitted to taking up to 6 pills per day. His wife had been noticing confusion, hallucinations and agitation. CT Head, MRI and EEG were both normal, urinalysis was normal, Covid testing negative, urine drug screen was negative. He was admitted with acute metabolic encephalopathy with unclear etiology.   Assessment / Plan / Recommendation Clinical Impression  Patient presents with what appears to be a primary cognitive-based dysphagia as patient is with confusion, hallucinations, agitation and poor cooperation. He had dry sounding cough immediately after first couple sips of thin liquids, but likely that was from poor awareness to bolus, as he did not exhibit any further aspiration or penetration s/s. He had oral delays with puree solids due to attention and cooperation but did exhibit swallow initiation and voice remained clear. Suspect that patient will be able to transition to full PO's when confusion and agitation decrease. SLP Visit Diagnosis: Dysphagia, unspecified (R13.10)    Aspiration Risk  Moderate aspiration risk;Mild aspiration risk    Diet Recommendation NPO;Other (Comment)(thin liquids, puree solids intermittently with full supervision by Nursing or Therapy staff, only if patient alert and accepting)   Liquid Administration via: Cup;Straw Medication Administration: Crushed with puree Supervision: Full supervision/cueing for compensatory strategies;Staff to assist with self feeding Compensations: Minimize environmental distractions Postural Changes: Seated upright at 90 degrees    Other  Recommendations Oral Care Recommendations: Oral care BID;Staff/trained caregiver to provide oral care   Follow up Recommendations Other (comment)(TBD)      Frequency and Duration min 2x/week  1 week       Prognosis Prognosis for Safe Diet Advancement: Good Barriers to Reach Goals: Cognitive deficits      Swallow Study   General Date of Onset: 06/02/19 HPI: Patient is a 63 y.o. male with PMH: former Xanax abuse, HTN, HLD, GERD, hypothyroidism, depression, colitis, who presented to teh hospital with AMS. He has recently been taking Flexeril for lower back pain and although prescription was for 10 mg 3 times a day, patient had admitted to taking up to 6 pills per day. His wife had been noticing confusion, hallucinations and agitation. CT Head, MRI and EEG were both normal, urinalysis was normal, Covid testing negative, urine drug screen was negative. He was admitted with acute metabolic encephalopathy with unclear etiology. Type of Study: Bedside Swallow Evaluation Previous Swallow Assessment: N/a Diet Prior to this Study: NPO Temperature Spikes Noted: No Respiratory Status: Room air History of Recent Intubation: No Behavior/Cognition: Alert;Pleasant mood;Confused;Agitated;Uncooperative;Requires cueing;Doesn't follow directions Oral Cavity Assessment: Dried secretions Oral Care Completed by SLP: Other (Comment)(patient allowed  for very limited oral care) Oral Cavity - Dentition:  Dentures, top;Other (Comment)(patient would not allow for placement of lower dentures) Self-Feeding Abilities: Total assist;Refused PO Patient Positioning: Upright in bed Baseline Vocal Quality: Normal Volitional Cough: Cognitively unable to elicit Volitional Swallow: Unable to elicit    Oral/Motor/Sensory Function Overall Oral Motor/Sensory Function: Other (comment)(difficult to assess secondary to patient's refusal but appeared to be [facility] with perhaps mild generalized weakness)   Ice Chips     Thin Liquid Thin Liquid: Impaired Presentation: Straw Oral Phase Impairments: Poor awareness of bolus;Reduced labial seal Oral Phase Functional Implications: Prolonged oral transit Pharyngeal  Phase Impairments: Cough - Immediate Other Comments: initially had immediate cough but this appeared secondary to patient's poor awareness to PO intake    Nectar Thick     Honey Thick     Puree Puree: Impaired Oral Phase Impairments: Poor awareness of bolus Oral Phase Functional Implications: Prolonged oral transit   Solid     Solid: Not tested      Michael Espinoza, Michael Espinoza 06/02/2019,5:13 PM   Angela NevinJohn T. Nolin Grell, MA, CCC-SLP Speech Therapy [facility] Pager: [phone]

## 2019-06-02 NOTE — Progress Notes (Signed)
Wife Pamala Hurry just left bedside.  Hospital does not have sitter or telemonitoring available.  Door to room left open for observation, bed exit is set, pt is still in 4 pt restraints. Pt goes from being calm to agitated every few seconds.

## 2019-06-03 LAB — COMPREHENSIVE METABOLIC PANEL
ALT: 29 U/L (ref 0–44)
AST: 36 U/L (ref 15–41)
Albumin: 3.4 g/dL — ABNORMAL LOW (ref 3.5–5.0)
Alkaline Phosphatase: 79 U/L (ref 38–126)
Anion gap: 9 (ref 5–15)
BUN: <5 mg/dL — ABNORMAL LOW (ref 8–23)
CO2: 24 mmol/L (ref 22–32)
Calcium: 9.3 mg/dL (ref 8.9–10.3)
Chloride: 108 mmol/L (ref 98–111)
Creatinine, Ser: 0.56 mg/dL — ABNORMAL LOW (ref 0.61–1.24)
GFR calc Af Amer: >60 mL/min (ref >60)
GFR calc non Af Amer: >60 mL/min (ref >60)
Glucose, Bld: 130 mg/dL — ABNORMAL HIGH (ref 70–99)
Potassium: 3.9 mmol/L (ref 3.5–5.1)
Sodium: 141 mmol/L (ref 135–145)
Total Bilirubin: 1.8 mg/dL — ABNORMAL HIGH (ref 0.3–1.2)
Total Protein: 6.2 g/dL — ABNORMAL LOW (ref 6.5–8.1)

## 2019-06-03 LAB — CBC WITH DIFFERENTIAL/PLATELET
Abs Immature Granulocytes: 0.03 10*3/uL (ref 0.00–0.07)
Basophils Absolute: 0 10*3/uL (ref 0.0–0.1)
Basophils Relative: 0 %
Eosinophils Absolute: 0.2 10*3/uL (ref 0.0–0.5)
Eosinophils Relative: 3 %
HCT: 36.1 % — ABNORMAL LOW (ref 39.0–52.0)
Hemoglobin: 12 g/dL — ABNORMAL LOW (ref 13.0–17.0)
Immature Granulocytes: 1 %
Lymphocytes Relative: 14 %
Lymphs Abs: 0.8 10*3/uL (ref 0.7–4.0)
MCH: 30.9 pg (ref 26.0–34.0)
MCHC: 33.2 g/dL (ref 30.0–36.0)
MCV: 93 fL (ref 80.0–100.0)
Monocytes Absolute: 0.7 10*3/uL (ref 0.1–1.0)
Monocytes Relative: 12 %
Neutro Abs: 4 10*3/uL (ref 1.7–7.7)
Neutrophils Relative %: 70 %
Platelets: 212 10*3/uL (ref 150–400)
RBC: 3.88 MIL/uL — ABNORMAL LOW (ref 4.22–5.81)
RDW: 12.2 % (ref 11.5–15.5)
WBC: 5.6 10*3/uL (ref 4.0–10.5)
nRBC: 0 % (ref 0.0–0.2)

## 2019-06-03 LAB — GLUCOSE, CAPILLARY
Glucose-Capillary: 108 mg/dL — ABNORMAL HIGH (ref 70–99)
Glucose-Capillary: 112 mg/dL — ABNORMAL HIGH (ref 70–99)
Glucose-Capillary: 116 mg/dL — ABNORMAL HIGH (ref 70–99)
Glucose-Capillary: 121 mg/dL — ABNORMAL HIGH (ref 70–99)

## 2019-06-03 LAB — TSH: TSH: 3.193 u[IU]/mL (ref 0.350–4.500)

## 2019-06-03 LAB — AMMONIA: Ammonia: 10 umol/L (ref 9–35)

## 2019-06-03 LAB — MAGNESIUM: Magnesium: 1.8 mg/dL (ref 1.7–2.4)

## 2019-06-03 MED ORDER — HALOPERIDOL LACTATE 5 MG/ML IJ SOLN
2.0000 mg | Freq: Four times a day (QID) | INTRAMUSCULAR | Status: DC | PRN
  Administered 2019-06-03 – 2019-06-07 (×6): 2 mg via INTRAVENOUS
  Filled 2019-06-03 (×6): qty 1

## 2019-06-03 MED ORDER — VITAMIN B-1 100 MG PO TABS
100.0000 mg | ORAL_TABLET | Freq: Every day | ORAL | Status: DC
  Administered 2019-06-04 – 2019-06-12 (×9): 100 mg via ORAL
  Filled 2019-06-03 (×10): qty 1

## 2019-06-03 NOTE — Progress Notes (Signed)
  Speech Language Pathology Treatment: Dysphagia  Patient Details Name: Michael Espinoza MRN: [id number] DOB: 12-18-1955 Today's Date: 06/03/2019 Time: 1572-6203 SLP Time Calculation (min) (ACUTE ONLY): 16 min  Assessment / Plan / Recommendation Clinical Impression  Skilled treatment session focused on dysphagia. SLP recieved pt awake, alert, oriented and talkative. Pt's mentation appears much improved over report from previous date's session. Pt requesting "real food." Skilled observation was provided of pt consuming peanut butter crackers with thin liquids via straw. Pt with appropriate oral clear, timely mastication and no overt s/s of aspiration. Pt's voice remained clear throughout consumption. Nursing present at end of session, recommendation made to upgrade to regular with thin liquids (may use straw). Education provided. St to continue to follow.    HPI HPI: Patient is a 63 y.o. male with PMH: former Xanax abuse, HTN, HLD, GERD, hypothyroidism, depression, colitis, who presented to teh hospital with AMS. He has recently been taking Flexeril for lower back pain and although prescription was for 10 mg 3 times a day, patient had admitted to taking up to 6 pills per day. His wife had been noticing confusion, hallucinations and agitation. CT Head, MRI and EEG were both normal, urinalysis was normal, Covid testing negative, urine drug screen was negative. He was admitted with acute metabolic encephalopathy with unclear etiology.      SLP Plan  Continue with current plan of care       Recommendations  Diet recommendations: Regular;Thin liquid Liquids provided via: Straw;Cup Medication Administration: Whole meds with liquid Supervision: Patient able to self feed Compensations: Minimize environmental distractions;Slow rate;Small sips/bites Postural Changes and/or Swallow Maneuvers: Seated upright 90 degrees                Oral Care Recommendations: Oral care BID;Staff/trained caregiver to  provide oral care Follow up Recommendations: (TBD) SLP Visit Diagnosis: Dysphagia, unspecified (R13.10) Plan: Continue with current plan of care       GO                Freya Zobrist 06/03/2019, 1:03 PM

## 2019-06-03 NOTE — Progress Notes (Signed)
I called pt's wife, Michael Espinoza, and let her talk with Michael Espinoza.  I gave Michael Espinoza an update with today's orders and that pt is sitting up feeing himself and wrist restraint removed so pt can feed himself apple sauce and pudding. She said she has been sick and cannot visit today with her sugar being over 300mg /dl.

## 2019-06-03 NOTE — Progress Notes (Signed)
Secure chatted Dr. Doristine Bosworth: "Pt is actively hallucinating and really aggitated. He is saying different family members are in the room with him. hes stating that he's just performed cpr on his wife. but when I stay with him he's redirectable, sort of.  But he continues to hallucinate and pull at everything.  I'm just wondering if he needs a psych evaluation so they can see what's going on or if you think he just needs some meds. "  Pt has 4 pt restraints and mittens on, yet he continues to wiggle out of mitts and restraints.  I have asked for a sitter multiple times and am told we do not have staffing for that.    Pt's wife, Pamala Hurry, states she cannot come because she is at home sick with diarrhea and hyperglycemia.

## 2019-06-03 NOTE — Progress Notes (Signed)
Patient is still agitated, confused, not cooperative and try to pull lines and tubes. He is on 4 pt soft restrains. Bed alarm on, frequent verbal contacts and reorient the patient. Will continue monitor and notified as needed.

## 2019-06-03 NOTE — Progress Notes (Signed)
PROGRESS NOTE    Michael CopasConnie Idris  FAO:[id number] DOB: 07/30/1956 DOA: 06/01/2019 PCP: Roanna RaiderBuno, [name], PA   Brief Narrative:  Michael Espinoza is a 63 y.o. male with medical history significant of former Xanax abuse, hypertension, hyperlipidemia, GERD, hypothyroidism, depression, colitis, who presented to OSH [facility]) with altered mental status, hallucinations and agitation on 05/29/2019.  Per report, patient had a history of Xanax addiction that was tapered and discontinued 2014, and has since been clean. Recently due to lower back pain, patient was seeing chiropractor.  He has been taking Flexeril initially 10 mg 3 times per day, but the patient admitted that that he has been taking up to 6 pills/day.  Since then he was noted by his wife to be confused, with hallucination and agitation.  Patient stopped taking Flexeril, but his mental status has not improved.   No history of alcohol abuse, recreational drug or illicit drug use.  No unilateral weakness, numbness in extremities.  No facial droop or slurred speech.  No chest pain, shortness breath, cough.  No nausea vomiting, diarrhea, abdominal pain, symptoms of UTI.  Pt was treated with prn zypexa and Geodon in another hospital. CT head and MRI brain had no acute findings. Urine drug screen is negative. Patient had negative urinalysis, vitamin B 12 level 462 which is normal, negative COVID-19 test, negative chest x-ray.  Due to lack of neurology and psychiatry services, [facility].  Neurology was consulted.  No further imaging studies were done here.  All his benzodiazepines were held to wait for his body to clear up benzodiazepines.  He has improved significantly since then.  Subjective: Patient seen and examined at the bedside with the nurse.  Patient is doing much better today.  He was in mittens and restraints however he was alert and oriented x3 today.  Did not have any complaint.  Looked very  weak.  Assessment & Plan:   Principal Problem:   Acute metabolic encephalopathy Active Problems:   Hypertension   Hypothyroidism   HLD (hyperlipidemia)   GERD (gastroesophageal reflux disease)   Depression   Acute metabolic encephalopathy/hallucinations: Likely due to Flexeril withdrawal with possible underlying dementia.  Had negative CT head and MRI.  Neurology on board.  Doing much better today.  Continue Seroquel.  Hypertension: Reportedly he is on Cozaar, amlodipine and propranolol at home.  Blood pressure stable despite of being off of the medications.  Continue monitor with PRN hydralazine.  Hypothyroidism: Continue Synthroid.  Hyperlipidemia: Hold Lipitor until able to take p.o.  GERD: Continue IV Pepcid.  Hypokalemia: Resolved.  Hypoglycemia/dysphagia due to lethargy: Blood sugar controlled now.  Continue dextrose with IV fluids with n.p.o.  Speech to reevaluate again today.  DVT prophylaxis: Lovenox Code Status: Full code Family Communication: Called and discussed with his wife Britta MccreedyBarbara and updated her on 06/02/2019.  Answered several questions.  She was grateful for the call. Disposition Plan: TBD.  Will consult PT OT.  Objective: Vitals:   06/03/19 0009 06/03/19 0430 06/03/19 0432 06/03/19 0800  BP:  (!) 149/89  (!) 138/95  Pulse:  86    Resp:      Temp: 98.4 F (36.9 C)  98.7 F (37.1 C) 98 F (36.7 C)  TempSrc: Axillary  Axillary Axillary  SpO2:  98%    Weight:      Height:        Intake/Output Summary (Last 24 hours) at 06/03/2019 1038 Last data filed at 06/03/2019 0800 Gross  per 24 hour  Intake 3027.92 ml  Output 2800 ml  Net 227.92 ml   Filed Weights   06/01/19 1041 06/02/19 0352  Weight: 65.8 kg 67 kg    Consultants:   Neurology  Procedures:   None  Antimicrobials:   None  Objective: Vitals:   06/03/19 0009 06/03/19 0430 06/03/19 0432 06/03/19 0800  BP:  (!) 149/89  (!) 138/95  Pulse:  86    Resp:      Temp: 98.4 F (36.9 C)   98.7 F (37.1 C) 98 F (36.7 C)  TempSrc: Axillary  Axillary Axillary  SpO2:  98%    Weight:      Height:        Intake/Output Summary (Last 24 hours) at 06/03/2019 1038 Last data filed at 06/03/2019 0800 Gross per 24 hour  Intake 3027.92 ml  Output 2800 ml  Net 227.92 ml   Filed Weights   06/01/19 1041 06/02/19 0352  Weight: 65.8 kg 67 kg    Examination:  General exam: Appears calm and comfortable, looks weak Respiratory system: Clear to auscultation. Respiratory effort normal. Cardiovascular system: S1 & S2 heard, RRR. No JVD, murmurs, rubs, gallops or clicks. No pedal edema. Gastrointestinal system: Abdomen is nondistended, soft and nontender. No organomegaly or masses felt. Normal bowel sounds heard. Central nervous system: Alert and oriented x3. No focal neurological deficits. Extremities: Symmetric 5 x 5 power. Skin: No rashes, lesions or ulcers Psychiatry: Judgement and insight appear poor mood & affect flat   Data Reviewed: I have personally reviewed following labs and imaging studies  CBC: Recent Labs  Lab 06/01/19 0442 06/02/19 0745 06/03/19 0740  WBC 9.1 6.7 5.6  NEUTROABS  --  5.1 4.0  HGB 12.1* 11.5* 12.0*  HCT 35.6* 33.3* 36.1*  MCV 90.6 90.0 93.0  PLT 209 193 814   Basic Metabolic Panel: Recent Labs  Lab 06/01/19 0442 06/02/19 0745 06/03/19 0740  NA 140 142 141  K 2.8* 3.3* 3.9  CL 106 110 108  CO2 21* 22 24  GLUCOSE 87 116* 130*  BUN 7* <5* <5*  CREATININE 0.80 0.58* 0.56*  CALCIUM 8.8* 8.9 9.3  MG  --  1.7 1.8   GFR: Estimated Creatinine Clearance: 88.4 mL/min (A) (by C-G formula based on SCr of 0.56 mg/dL (L)). Liver Function Tests: Recent Labs  Lab 06/02/19 0745 06/03/19 0740  AST 35 36  ALT 28 29  ALKPHOS 79 79  BILITOT 2.1* 1.8*  PROT 5.8* 6.2*  ALBUMIN 3.5 3.4*   No results for input(s): LIPASE, AMYLASE in the last 168 hours. No results for input(s): AMMONIA in the last 168 hours. Coagulation Profile: No results for  input(s): INR, PROTIME in the last 168 hours. Cardiac Enzymes: No results for input(s): CKTOTAL, CKMB, CKMBINDEX, TROPONINI in the last 168 hours. BNP (last 3 results) No results for input(s): PROBNP in the last 8760 hours. HbA1C: No results for input(s): HGBA1C in the last 72 hours. CBG: Recent Labs  Lab 06/02/19 1601 06/02/19 2033 06/03/19 0008 06/03/19 0431 06/03/19 0828  GLUCAP 100* 109* 121* 112* 116*   Lipid Profile: No results for input(s): CHOL, HDL, LDLCALC, TRIG, CHOLHDL, LDLDIRECT in the last 72 hours. Thyroid Function Tests: No results for input(s): TSH, T4TOTAL, FREET4, T3FREE, THYROIDAB in the last 72 hours. Anemia Panel: Recent Labs    06/01/19 1211  VITAMINB12 331   Sepsis Labs: Recent Labs  Lab 06/02/19 0745  PROCALCITON <0.10    No results found for this or  any previous visit (from the past 240 hour(s)).    Radiology Studies: Dg Chest Port 1 View  Result Date: 06/02/2019 CLINICAL DATA:  Altered mental status EXAM: PORTABLE CHEST 1 VIEW COMPARISON:  A 420 FINDINGS: Midline trachea.  Normal heart size and mediastinal contours. Sharp costophrenic angles.  No pneumothorax.  Clear lungs. Numerous leads and wires project over the chest. IMPRESSION: No active disease. Electronically Signed   By: Jeronimo GreavesKyle  Talbot M.D.   On: 06/02/2019 10:21    Scheduled Meds:  enoxaparin (LOVENOX) injection  40 mg Subcutaneous Q24H   levothyroxine  25 mcg Intravenous Daily   QUEtiapine  25 mg Oral QHS   Continuous Infusions:  dextrose 5 % and 0.9 % NaCl with KCl 40 mEq/L 125 mL/hr at 06/03/19 0240   famotidine (PEPCID) IV 20 mg (06/03/19 1033)     LOS: 2 days   Time spent: 28 minutes   Hughie Clossavi Contessa Preuss, MD [facility] Pager [phone]  If 7PM-7AM, please contact night-coverage [email or web address] Password [email or web address] 06/03/2019, 10:38 AM

## 2019-06-03 NOTE — Progress Notes (Signed)
Subjective: Patient continues to improve.  He apparently did have a hallucination of a person in the room earlier.  Exam: Vitals:   06/03/19 0800 06/03/19 1200  BP: (!) 138/95 (!) 141/98  Pulse:    Resp:    Temp: 98 F (36.7 C)   SpO2:     Gen: In bed, NAD Resp: non-labored breathing, no acute distress Abd: soft, nt  Neuro: Cranial nerves: Pupils equal round and reactive, extraocular movements intact, visual fields full MS: Awakens easily, follows commands readily, easy to understand, knows that he has been transferred to [facility], but forgot the name of the institution, oriented to month and year. motor: He moves all extremities r with good strength Sensory: Endorses symmetric sensation  Pertinent Labs: B12 330 B1-pending Procalcitonin-negative CMP-elevated total bilirubin   Impression: 63 yo M with mild dementia with acute delirium starting after cyclobenzaprine starting. I suspect he has been slow to clear and now represents essentially a hospitalization related delirium.  With normal MRI and EEG, I have low suspicion for other etiologies.  I also wonder if the acute stress of retirement has played a role in precipitating this as well, and if it could be a psychiatric component.  Recommendations: 1) ammonia, TSH, defer to IM if either thyroid or hepatic insufficiency are revealed.  2) If B1 is low, would start high dose repletion, but low suspicion for this, can continue 100mg /day for now.  3) as long as the patient continues to improve as expected, no further recommendations.  Neurology is available as needed, but please call if there remains any further questions or concerns.   Roland Rack, MD [facility] [phone]  If 7pm- 7am, please page neurology on call as listed in [place].

## 2019-06-04 LAB — CBC WITH DIFFERENTIAL/PLATELET
Abs Immature Granulocytes: 0.02 10*3/uL (ref 0.00–0.07)
Basophils Absolute: 0 10*3/uL (ref 0.0–0.1)
Basophils Relative: 1 %
Eosinophils Absolute: 0.2 10*3/uL (ref 0.0–0.5)
Eosinophils Relative: 4 %
HCT: 33.9 % — ABNORMAL LOW (ref 39.0–52.0)
Hemoglobin: 11.4 g/dL — ABNORMAL LOW (ref 13.0–17.0)
Immature Granulocytes: 0 %
Lymphocytes Relative: 22 %
Lymphs Abs: 1 10*3/uL (ref 0.7–4.0)
MCH: 31 pg (ref 26.0–34.0)
MCHC: 33.6 g/dL (ref 30.0–36.0)
MCV: 92.1 fL (ref 80.0–100.0)
Monocytes Absolute: 0.7 10*3/uL (ref 0.1–1.0)
Monocytes Relative: 15 %
Neutro Abs: 2.8 10*3/uL (ref 1.7–7.7)
Neutrophils Relative %: 58 %
Platelets: 198 10*3/uL (ref 150–400)
RBC: 3.68 MIL/uL — ABNORMAL LOW (ref 4.22–5.81)
RDW: 12.1 % (ref 11.5–15.5)
WBC: 4.7 10*3/uL (ref 4.0–10.5)
nRBC: 0 % (ref 0.0–0.2)

## 2019-06-04 LAB — COMPREHENSIVE METABOLIC PANEL
ALT: 26 U/L (ref 0–44)
AST: 28 U/L (ref 15–41)
Albumin: 3.3 g/dL — ABNORMAL LOW (ref 3.5–5.0)
Alkaline Phosphatase: 77 U/L (ref 38–126)
Anion gap: 9 (ref 5–15)
BUN: <5 mg/dL — ABNORMAL LOW (ref 8–23)
CO2: 23 mmol/L (ref 22–32)
Calcium: 9.1 mg/dL (ref 8.9–10.3)
Chloride: 108 mmol/L (ref 98–111)
Creatinine, Ser: 0.6 mg/dL — ABNORMAL LOW (ref 0.61–1.24)
GFR calc Af Amer: >60 mL/min (ref >60)
GFR calc non Af Amer: >60 mL/min (ref >60)
Glucose, Bld: 111 mg/dL — ABNORMAL HIGH (ref 70–99)
Potassium: 3.8 mmol/L (ref 3.5–5.1)
Sodium: 140 mmol/L (ref 135–145)
Total Bilirubin: 1.3 mg/dL — ABNORMAL HIGH (ref 0.3–1.2)
Total Protein: 5.9 g/dL — ABNORMAL LOW (ref 6.5–8.1)

## 2019-06-04 LAB — VITAMIN B1: Vitamin B1 (Thiamine): 136.4 nmol/L (ref 66.5–200.0)

## 2019-06-04 MED ORDER — ATORVASTATIN CALCIUM 10 MG PO TABS
10.0000 mg | ORAL_TABLET | Freq: Every day | ORAL | Status: DC
  Administered 2019-06-04 – 2019-06-12 (×9): 10 mg via ORAL
  Filled 2019-06-04 (×9): qty 1

## 2019-06-04 MED ORDER — PANTOPRAZOLE SODIUM 40 MG PO TBEC
40.0000 mg | DELAYED_RELEASE_TABLET | Freq: Every day | ORAL | Status: DC
  Administered 2019-06-04 – 2019-06-12 (×9): 40 mg via ORAL
  Filled 2019-06-04 (×9): qty 1

## 2019-06-04 MED ORDER — LEVOTHYROXINE SODIUM 50 MCG PO TABS
50.0000 ug | ORAL_TABLET | Freq: Every day | ORAL | Status: DC
  Administered 2019-06-05 – 2019-06-13 (×9): 50 ug via ORAL
  Filled 2019-06-04 (×9): qty 1

## 2019-06-04 MED ORDER — QUETIAPINE FUMARATE 25 MG PO TABS
50.0000 mg | ORAL_TABLET | Freq: Every day | ORAL | Status: DC
  Administered 2019-06-04 – 2019-06-10 (×7): 50 mg via ORAL
  Filled 2019-06-04 (×7): qty 2

## 2019-06-04 MED ORDER — ASPIRIN EC 81 MG PO TBEC
81.0000 mg | DELAYED_RELEASE_TABLET | Freq: Every day | ORAL | Status: DC
  Administered 2019-06-04 – 2019-06-12 (×9): 81 mg via ORAL
  Filled 2019-06-04 (×9): qty 1

## 2019-06-04 MED ORDER — CHOLESTYRAMINE 4 G PO PACK
1.0000 | PACK | Freq: Every day | ORAL | Status: DC
  Administered 2019-06-04 – 2019-06-12 (×9): 1 via ORAL
  Filled 2019-06-04 (×10): qty 1

## 2019-06-04 MED ORDER — AMLODIPINE BESYLATE 5 MG PO TABS
5.0000 mg | ORAL_TABLET | Freq: Every day | ORAL | Status: DC
  Administered 2019-06-04 – 2019-06-12 (×9): 5 mg via ORAL
  Filled 2019-06-04 (×9): qty 1

## 2019-06-04 MED ORDER — QUETIAPINE FUMARATE 25 MG PO TABS
25.0000 mg | ORAL_TABLET | Freq: Every day | ORAL | Status: DC
  Administered 2019-06-05 – 2019-06-12 (×8): 25 mg via ORAL
  Filled 2019-06-04 (×8): qty 1

## 2019-06-04 MED ORDER — PROPRANOLOL HCL 10 MG PO TABS
10.0000 mg | ORAL_TABLET | Freq: Two times a day (BID) | ORAL | Status: DC
  Administered 2019-06-04 – 2019-06-12 (×18): 10 mg via ORAL
  Filled 2019-06-04 (×19): qty 1

## 2019-06-04 NOTE — Progress Notes (Signed)
  Speech Language Pathology Treatment: Dysphagia  Patient Details Name: Michael Espinoza MRN: [id number] DOB: 09-11-1956 Today's Date: 06/04/2019 Time: 1062-6948 SLP Time Calculation (min) (ACUTE ONLY): 20 min  Assessment / Plan / Recommendation Clinical Impression  Pt was seen for skilled ST targeting dysphagia goals.  Pt was seated upright in recliner upon therapist's arrival, oriented to month, day, and year but not place, specific date, or situation.  Pt was alert and pleasantly cooperative throughout session and consumed regular textures and thin liquids with no overt s/s of aspiration with solids or liquids.  Wife arrived towards the end of today's therapy session and pt demonstrated slightly increased restlessness at her arrival.  Wife reported concerns over pt's ongoing AMS with hallucinations.  Pt was observed talking to family members who were not in the room.  RN made aware.  Pt was left in recliner with wife at bedside.  No further needs indicated at this time for dysphagia.    HPI HPI: Patient is a 63 y.o. male with PMH: former Xanax abuse, HTN, HLD, GERD, hypothyroidism, depression, colitis, who presented to teh hospital with AMS. He has recently been taking Flexeril for lower back pain and although prescription was for 10 mg 3 times a day, patient had admitted to taking up to 6 pills per day. His wife had been noticing confusion, hallucinations and agitation. CT Head, MRI and EEG were both normal, urinalysis was normal, Covid testing negative, urine drug screen was negative. He was admitted with acute metabolic encephalopathy with unclear etiology.      SLP Plan  Continue with current plan of care       Recommendations  Diet recommendations: Regular;Thin liquid Liquids provided via: Straw;Cup Medication Administration: Whole meds with liquid Supervision: Patient able to self feed Compensations: Minimize environmental distractions;Slow rate;Small sips/bites Postural Changes and/or  Swallow Maneuvers: Seated upright 90 degrees                Plan: Continue with current plan of care       GO                Amanii Snethen, Selinda Orion 06/04/2019, 10:59 AM

## 2019-06-04 NOTE — Progress Notes (Signed)
Pt more confused, getting out of bed. Attempted redirection, busy vest, bed alarm, frequent rounding. Sitter ordered and MD made aware. MD said telemetry monitoring can be d/c'd.

## 2019-06-04 NOTE — Progress Notes (Signed)
PROGRESS NOTE    Michael Espinoza  RCV:[id number] DOB: [birth year] DOA: 06/01/2019 PCP: Ramond Craver, PA   Brief Narrative:  Michael Espinoza is a 63 y.o. male with medical history significant of former Xanax abuse, hypertension, hyperlipidemia, GERD, hypothyroidism, depression, colitis, who presented to OSH [facility]) with altered mental status, hallucinations and agitation on 05/29/2019.  Per report, patient had a history of Xanax addiction that was tapered and discontinued 2014, and has since been clean. Recently due to lower back pain, patient was seeing chiropractor.  He has been taking Flexeril initially 10 mg 3 times per day, but the patient admitted that that he has been taking up to 6 pills/day.  Since then he was noted by his wife to be confused, with hallucination and agitation.  Patient stopped taking Flexeril, but his mental status has not improved.   No history of alcohol abuse, recreational drug or illicit drug use.  No unilateral weakness, numbness in extremities.  No facial droop or slurred speech.  No chest pain, shortness breath, cough.  No nausea vomiting, diarrhea, abdominal pain, symptoms of UTI.  Pt was treated with prn zypexa and Geodon in another hospital. CT head and MRI brain had no acute findings. Urine drug screen is negative. Patient had negative urinalysis, vitamin B 12 level 462 which is normal, negative COVID-19 test, negative chest x-ray.  Due to lack of neurology and psychiatry services, patient was transferred to [facility].  Neurology was consulted.  No further imaging studies were done here.  All his benzodiazepines were held to wait for his body to clear up benzodiazepines.  He has improved significantly since then.  He is alert and oriented x4 however he continues to have visual as well as auditory hallucinations.  Assessment & Plan:   Principal Problem:   Acute metabolic encephalopathy Active Problems:   Hypertension   Hypothyroidism   HLD (hyperlipidemia)   GERD (gastroesophageal reflux disease)   Depression   Acute metabolic encephalopathy/hallucinations: Likely due to Flexeril withdrawal with possible underlying dementia.  Had negative CT head and MRI.  Neurology on board.  Doing much better today now that he is alert and oriented x4 but is still having hallucinations.  He has been on Seroquel and that has helped him significantly.  Haldol as needed for agitation.  I have consulted psychiatry to evaluate him further.  Hypertension: Reportedly he is on Cozaar, amlodipine and propranolol at home.  Blood pressure was initially low and then became normal and now slightly elevated so I will resume his amlodipine and propranolol but hold Cozaar.  Continue PRN hydralazine.   Hypothyroidism: Continue Synthroid.  Hyperlipidemia: Hold Lipitor until able to take p.o.  GERD: Continue IV Pepcid.  Hypokalemia: Resolved.  Hypoglycemia/dysphagia due to lethargy: Blood sugar controlled now.  Evaluated by speech.  Now that he is more alert, he has been started on regular diet.  Resuming some medications.  DC IV fluids.    DVT prophylaxis: Lovenox Code Status: Full code Family Communication: Called and discussed with his wife Pamala Hurry and updated her on 06/02/2019.  Answered several questions.  She was grateful for the call. Disposition Plan: TBD.  consult PT OT.  Subjective: Patient seen and examined.  He was off of restraints.  Eating breakfast.  He was completely alert and oriented x4 however he thought that he was working on and off his Architect yesterday and day before.  Strangely, he was able to tell me that he is in the hospital but  could not tell me the name.  He was also able to tell me today's month, year and current POTUS.  Objective: Vitals:   06/03/19 1955 06/03/19 2355 06/04/19 0355 06/04/19 0646  BP: (!) 145/102 120/82 (!) 141/89   Pulse: 76 98 80   Resp: 17 13 12    Temp: 99 F (37.2 C)   97.9 F (36.6 C)   TempSrc: Oral   Oral  SpO2: 100% 97% 95%   Weight:      Height:        Intake/Output Summary (Last 24 hours) at 06/04/2019 1009 Last data filed at 06/04/2019 0600 Gross per 24 hour  Intake 3432.4 ml  Output 1950 ml  Net 1482.4 ml   Filed Weights   06/01/19 1041 06/02/19 0352  Weight: 65.8 kg 67 kg    Consultants:   Neurology  Procedures:   None  Antimicrobials:   None  Objective: Vitals:   06/03/19 1956 06/03/19 2355 06/04/19 0355 06/04/19 0646  BP: (!) 145/102 120/82 (!) 141/89   Pulse: 76 98 80   Resp: 17 13 12    Temp: 99 F (37.2 C)   97.9 F (36.6 C)  TempSrc: Oral   Oral  SpO2: 100% 97% 95%   Weight:      Height:        Intake/Output Summary (Last 24 hours) at 06/04/2019 1009 Last data filed at 06/04/2019 0600 Gross per 24 hour  Intake 3432.4 ml  Output 1950 ml  Net 1482.4 ml   Filed Weights   06/01/19 1041 06/02/19 0352  Weight: 65.8 kg 67 kg    Examination:  General exam: Appears calm and comfortable  Respiratory system: Clear to auscultation. Respiratory effort normal. Cardiovascular system: S1 & S2 heard, RRR. No JVD, murmurs, rubs, gallops or clicks. No pedal edema. Gastrointestinal system: Abdomen is nondistended, soft and nontender. No organomegaly or masses felt. Normal bowel sounds heard. Central nervous system: Alert and oriented. No focal neurological deficits. Extremities: Symmetric 5 x 5 power. Skin: No rashes, lesions or ulcers Psychiatry: Judgement and insight appear poor. Mood & affect appropriate.    Data Reviewed: I have personally reviewed following labs and imaging studies  CBC: Recent Labs  Lab 06/01/19 0442 06/02/19 0745 06/03/19 0740 06/04/19 0333  WBC 9.1 6.7 5.6 4.7  NEUTROABS  --  5.1 4.0 2.8  HGB 12.1* 11.5* 12.0* 11.4*  HCT 35.6* 33.3* 36.1* 33.9*  MCV 90.6 90.0 93.0 92.1  PLT 209 193 212 198   Basic Metabolic Panel: Recent Labs  Lab 06/01/19 0442 06/02/19 0745 06/03/19 0740 06/04/19 0333  NA 140  142 141 140  K 2.8* 3.3* 3.9 3.8  CL 106 110 108 108  CO2 21* 22 24 23   GLUCOSE 87 116* 130* 111*  BUN 7* <5* <5* <5*  CREATININE 0.80 0.58* 0.56* 0.60*  CALCIUM 8.8* 8.9 9.3 9.1  MG  --  1.7 1.8  --    GFR: Estimated Creatinine Clearance: 88.4 mL/min (A) (by C-G formula based on SCr of 0.6 mg/dL (L)). Liver Function Tests: Recent Labs  Lab 06/02/19 0745 06/03/19 0740 06/04/19 0333  AST 35 36 28  ALT 28 29 26   ALKPHOS 79 79 77  BILITOT 2.1* 1.8* 1.3*  PROT 5.8* 6.2* 5.9*  ALBUMIN 3.5 3.4* 3.3*   No results for input(s): LIPASE, AMYLASE in the last 168 hours. Recent Labs  Lab 06/03/19 1356  AMMONIA 10   Coagulation Profile: No results for input(s): INR, PROTIME in the last 168  hours. Cardiac Enzymes: No results for input(s): CKTOTAL, CKMB, CKMBINDEX, TROPONINI in the last 168 hours. BNP (last 3 results) No results for input(s): PROBNP in the last 8760 hours. HbA1C: No results for input(s): HGBA1C in the last 72 hours. CBG: Recent Labs  Lab 06/02/19 2033 06/03/19 0008 06/03/19 0431 06/03/19 0828 06/03/19 1232  GLUCAP 109* 121* 112* 116* 108*   Lipid Profile: No results for input(s): CHOL, HDL, LDLCALC, TRIG, CHOLHDL, LDLDIRECT in the last 72 hours. Thyroid Function Tests: Recent Labs    06/03/19 1356  TSH 3.193   Anemia Panel: Recent Labs    06/01/19 1211  VITAMINB12 331   Sepsis Labs: Recent Labs  Lab 06/02/19 0745  PROCALCITON <0.10    No results found for this or any previous visit (from the past 240 hour(s)).    Radiology Studies: No results found.  Scheduled Meds: . enoxaparin (LOVENOX) injection  40 mg Subcutaneous Q24H  . levothyroxine  25 mcg Intravenous Daily  . QUEtiapine  25 mg Oral QHS  . thiamine  100 mg Oral Daily   Continuous Infusions: . dextrose 5 % and 0.9 % NaCl with KCl 40 mEq/L 125 mL/hr at 06/04/19 0324  . famotidine (PEPCID) IV 20 mg (06/04/19 0815)     LOS: 3 days   Time spent: 27 minutes   Hughie Clossavi Hazle Ogburn,  MD [facility] Pager [phone]  If 7PM-7AM, please contact night-coverage [email or web address] Password [email or web address] 06/04/2019, 10:09 AM

## 2019-06-04 NOTE — Progress Notes (Addendum)
0730 - report received, pt awake in bed, oriented to self only, saying he had to get to [place] to see his doctor who he sees twice a day. Pt reoriented.  Pt in four point soft restraints, range of motion performed, restraints applied correctly, no skin breakdown or redness noted. Pt reported mittens were applied" too tightly" and that he was "losing feeling" in his fingers. Night shift and I removed mittens - cap refil brisk, pt performed range of motion with fingers, mittens reapplied loosely, skin color good, no redness noted at attatchment point. Will monitor closely for need for restraints. Order current.  0102 - pt oriented x3, following commands, removed restraints. Will frequently monitor.  0915 - pt reoriented, following commands but forgetful, requiring frequent reminders. Pt having visual hallucinations, speaking to a person in the chair (no one in the chair) and asking about "angels" across the room.

## 2019-06-04 NOTE — Consult Note (Signed)
Telepsych Consultation   Reason for Consult:  Hallucination Referring Physician:   Location of Patient:  [id number] Location of Provider: [facility]  Patient Identification: Michael CopasConnie Espinoza MRN:  [id number] Principal Diagnosis: Acute metabolic encephalopathy Diagnosis:  Principal Problem:   Acute metabolic encephalopathy Active Problems:   Hypertension   Hypothyroidism   HLD (hyperlipidemia)   GERD (gastroesophageal reflux disease)   Depression   Total Time spent with patient: 15 minutes  Subjective:   Michael CopasConnie Espinoza is a 63 y.o. male.  Patient seen via WebEx.  He is awake, alert and oriented x1. ( self only)  Chart reviewed psychiatric consult ordered for hallucinations and agitation.  While patient is currently denying suicidal or homicidal ideations.  Denies auditory or visual hallucinations.  Wife is at bedside who reports a new onset of confusion, worsening behavior and hallucinations.  Case staffed with attending psychiatrist.  Who recommends initiating Seroquel 25 mg p.o. twice daily and increasing 25 mg nightly to 50 mg nightly.  Continue to discontinue benzodiazepine. pending medical clearance. keep follow-up with neurology.  Support, encouragement and reassurance was provided.  HPI: Michael RidgelConnie Claytonis a 63 y.o.malewith medical history significant offormer Xanax abuse, hypertension, hyperlipidemia, GERD, hypothyroidism, depression, colitis, who presented to [facility] with altered mental status, hallucinations and agitation on 05/29/2019.Per report, patient had a history of Xanax addiction that was tapered and discontinued 2014, and has since been clean. Recently due to lower back pain, patient was seeing chiropractor. He has been taking Flexeril initially 10 mg 3 times per day, but the patient admitted that that he has beentaking up to 6 pills/day. Since then he was noted by his wife to be confused, with hallucination and agitation. Patient  stopped taking Flexeril, but his mental status has not improved.  No history of alcohol abuse, recreational drug or illicit drug use. No unilateral weakness, numbness in extremities. No facial droop or slurred speech. No chest pain, shortness breath, cough. No nausea vomiting, diarrhea, abdominal pain, symptoms of UTI. Pt was treated with prn zypexa and Geodonin another hospital.CT head andMRI brain hadno acute findings. Urine drug screen is negative.Patient had negative urinalysis, vitamin B 12 level 462 which is normal, negative COVID-19 test, negative chest x-ray.  Due to lack of neurology and psychiatry services, patient was [facility].  Neurology was consulted.  No further imaging studies were done here.  All his benzodiazepines were held to wait for his body to clear up benzodiazepines.  He has improved significantly since then.  Past Psychiatric History:   Risk to Self:   Risk to Others:   Prior Inpatient Therapy:   Prior Outpatient Therapy:    Past Medical History:  Past Medical History:  Diagnosis Date  . Colitis   . Depression   . Diverticulosis   . GERD (gastroesophageal reflux disease)   . HLD (hyperlipidemia)   . Hypertension   . Hypothyroidism     Past Surgical History:  Procedure Laterality Date  . CHOLECYSTECTOMY    . COLONOSCOPY    . Right ankle surgery     Family History:  Family History  Problem Relation Age of Onset  . Dementia Mother   . Hypertension Father    Family Psychiatric  History:  Social History:  Social History   Substance and Sexual Activity  Alcohol Use Not Currently     Social History   Substance and Sexual Activity  Drug Use Yes  . Types: Benzodiazepines    Social History  Socioeconomic History  . Marital status: Married    Spouse name: Not on file  . Number of children: Not on file  . Years of education: Not on file  . Highest education level: Not on file  Occupational History  . Not on file   Social Needs  . Financial resource strain: Not on file  . Food insecurity    Worry: Not on file    Inability: Not on file  . Transportation needs    Medical: Not on file    Non-medical: Not on file  Tobacco Use  . Smoking status: Never Smoker  . Smokeless tobacco: Never Used  Substance and Sexual Activity  . Alcohol use: Not Currently  . Drug use: Yes    Types: Benzodiazepines  . Sexual activity: Not on file  Lifestyle  . Physical activity    Days per week: Not on file    Minutes per session: Not on file  . Stress: Not on file  Relationships  . Social Musician on phone: Not on file    Gets together: Not on file    Attends religious service: Not on file    Active member of club or organization: Not on file    Attends meetings of clubs or organizations: Not on file    Relationship status: Not on file  Other Topics Concern  . Not on file  Social History Narrative  . Not on file   Additional Social History:    Allergies:   Allergies  Allergen Reactions  . Lexapro [Escitalopram] Other (See Comments)    Severe hallucination    Labs:  Results for orders placed or performed during the hospital encounter of 06/01/19 (from the past 48 hour(s))  Glucose, capillary     Status: Abnormal   Collection Time: 06/02/19  4:01 PM  Result Value Ref Range   Glucose-Capillary 100 (H) 70 - 99 mg/dL  Glucose, capillary     Status: Abnormal   Collection Time: 06/02/19  8:33 PM  Result Value Ref Range   Glucose-Capillary 109 (H) 70 - 99 mg/dL  Glucose, capillary     Status: Abnormal   Collection Time: 06/03/19 12:08 AM  Result Value Ref Range   Glucose-Capillary 121 (H) 70 - 99 mg/dL  Glucose, capillary     Status: Abnormal   Collection Time: 06/03/19  4:31 AM  Result Value Ref Range   Glucose-Capillary 112 (H) 70 - 99 mg/dL  CBC with Differential/Platelet     Status: Abnormal   Collection Time: 06/03/19  7:40 AM  Result Value Ref Range   WBC 5.6 4.0 - 10.5 K/uL    RBC 3.88 (L) 4.22 - 5.81 MIL/uL   Hemoglobin 12.0 (L) 13.0 - 17.0 g/dL   HCT 16.1 (L) 09.6 - 04.5 %   MCV 93.0 80.0 - 100.0 fL   MCH 30.9 26.0 - 34.0 pg   MCHC 33.2 30.0 - 36.0 g/dL   RDW 40.9 81.1 - 91.4 %   Platelets 212 150 - 400 K/uL   nRBC 0.0 0.0 - 0.2 %   Neutrophils Relative % 70 %   Neutro Abs 4.0 1.7 - 7.7 K/uL   Lymphocytes Relative 14 %   Lymphs Abs 0.8 0.7 - 4.0 K/uL   Monocytes Relative 12 %   Monocytes Absolute 0.7 0.1 - 1.0 K/uL   Eosinophils Relative 3 %   Eosinophils Absolute 0.2 0.0 - 0.5 K/uL   Basophils Relative 0 %   Basophils Absolute  0.0 0.0 - 0.1 K/uL   Immature Granulocytes 1 %   Abs Immature Granulocytes 0.03 0.00 - 0.07 K/uL    Comment: Performed at [facility], [address] [place], [place] [address]  Comprehensive metabolic panel     Status: Abnormal   Collection Time: 06/03/19  7:40 AM  Result Value Ref Range   Sodium 141 135 - 145 mmol/L   Potassium 3.9 3.5 - 5.1 mmol/L   Chloride 108 98 - 111 mmol/L   CO2 24 22 - 32 mmol/L   Glucose, Bld 130 (H) 70 - 99 mg/dL   BUN <5 (L) 8 - 23 mg/dL   Creatinine, Ser 9.140.56 (L) 0.61 - 1.24 mg/dL   Calcium 9.3 8.9 - 78.210.3 mg/dL   Total Protein 6.2 (L) 6.5 - 8.1 g/dL   Albumin 3.4 (L) 3.5 - 5.0 g/dL   AST 36 15 - 41 U/L   ALT 29 0 - 44 U/L   Alkaline Phosphatase 79 38 - 126 U/L   Total Bilirubin 1.8 (H) 0.3 - 1.2 mg/dL   GFR calc non Af Amer >60 >60 mL/min   GFR calc Af Amer >60 >60 mL/min   Anion gap 9 5 - 15    Comment: Performed at [facility], [address] [place], [place] [address]  Magnesium     Status: None   Collection Time: 06/03/19  7:40 AM  Result Value Ref Range   Magnesium 1.8 1.7 - 2.4 mg/dL    Comment: Performed at [facility], [address] [place], [place] [address]  Glucose, capillary     Status: Abnormal   Collection Time: 06/03/19  8:28 AM  Result Value Ref Range   Glucose-Capillary 116 (H) 70 - 99 mg/dL  Glucose, capillary     Status: Abnormal    Collection Time: 06/03/19 12:32 PM  Result Value Ref Range   Glucose-Capillary 108 (H) 70 - 99 mg/dL  Ammonia     Status: None   Collection Time: 06/03/19  1:56 PM  Result Value Ref Range   Ammonia 10 9 - 35 umol/L    Comment: Performed at [facility], [address] [place], [place] [address]  TSH     Status: None   Collection Time: 06/03/19  1:56 PM  Result Value Ref Range   TSH 3.193 0.350 - 4.500 uIU/mL    Comment: Performed by a 3rd Generation assay with a functional sensitivity of <=0.01 uIU/mL. Performed at [facility], [address] [place], [place] [address]   CBC with Differential/Platelet     Status: Abnormal   Collection Time: 06/04/19  3:33 AM  Result Value Ref Range   WBC 4.7 4.0 - 10.5 K/uL   RBC 3.68 (L) 4.22 - 5.81 MIL/uL   Hemoglobin 11.4 (L) 13.0 - 17.0 g/dL   HCT 84.133.9 (L) 32.439.0 - 40.152.0 %   MCV 92.1 80.0 - 100.0 fL   MCH 31.0 26.0 - 34.0 pg   MCHC 33.6 30.0 - 36.0 g/dL   RDW 02.712.1 25.311.5 - 66.415.5 %   Platelets 198 150 - 400 K/uL   nRBC 0.0 0.0 - 0.2 %   Neutrophils Relative % 58 %   Neutro Abs 2.8 1.7 - 7.7 K/uL   Lymphocytes Relative 22 %   Lymphs Abs 1.0 0.7 - 4.0 K/uL   Monocytes Relative 15 %   Monocytes Absolute 0.7 0.1 - 1.0 K/uL   Eosinophils Relative 4 %  Eosinophils Absolute 0.2 0.0 - 0.5 K/uL   Basophils Relative 1 %   Basophils Absolute 0.0 0.0 - 0.1 K/uL   Immature Granulocytes 0 %   Abs Immature Granulocytes 0.02 0.00 - 0.07 K/uL    Comment: Performed at [facility], [address] [place], [place] [address]  Comprehensive metabolic panel     Status: Abnormal   Collection Time: 06/04/19  3:33 AM  Result Value Ref Range   Sodium 140 135 - 145 mmol/L   Potassium 3.8 3.5 - 5.1 mmol/L   Chloride 108 98 - 111 mmol/L   CO2 23 22 - 32 mmol/L   Glucose, Bld 111 (H) 70 - 99 mg/dL   BUN <5 (L) 8 - 23 mg/dL   Creatinine, Ser 1.910.60 (L) 0.61 - 1.24 mg/dL   Calcium 9.1 8.9 - 47.810.3 mg/dL   Total Protein 5.9 (L) 6.5 - 8.1 g/dL    Albumin 3.3 (L) 3.5 - 5.0 g/dL   AST 28 15 - 41 U/L   ALT 26 0 - 44 U/L   Alkaline Phosphatase 77 38 - 126 U/L   Total Bilirubin 1.3 (H) 0.3 - 1.2 mg/dL   GFR calc non Af Amer >60 >60 mL/min   GFR calc Af Amer >60 >60 mL/min   Anion gap 9 5 - 15    Comment: Performed at [facility], [address] [place], [place] [address]    Medications:  Current Facility-Administered Medications  Medication Dose Route Frequency Provider Last Rate Last Dose  . acetaminophen (TYLENOL) tablet 650 mg  650 mg Oral Q6H PRN Lorretta HarpNiu, Xilin, MD      . amLODipine (NORVASC) tablet 5 mg  5 mg Oral Daily Hughie ClossPahwani, Ravi, MD   5 mg at 06/04/19 1516  . aspirin EC tablet 81 mg  81 mg Oral Daily Hughie ClossPahwani, Ravi, MD   81 mg at 06/04/19 1516  . atorvastatin (LIPITOR) tablet 10 mg  10 mg Oral Daily Hughie ClossPahwani, Ravi, MD   10 mg at 06/04/19 1515  . cholestyramine (QUESTRAN) packet 1 packet  1 packet Oral Daily Pahwani, Ravi, MD      . dextrose 50 % solution 50 mL  50 mL Intravenous PRN Lorretta HarpNiu, Xilin, MD   50 mL at 06/01/19 0829  . enoxaparin (LOVENOX) injection 40 mg  40 mg Subcutaneous Q24H Lorretta HarpNiu, Xilin, MD   40 mg at 06/04/19 1517  . famotidine (PEPCID) IVPB 20 mg premix  20 mg Intravenous Willette PaQ12H Niu, Xilin, MD   Stopped at 06/04/19 1223  . haloperidol lactate (HALDOL) injection 2 mg  2 mg Intravenous Q6H PRN Hughie ClossPahwani, Ravi, MD   2 mg at 06/04/19 1458  . hydrALAZINE (APRESOLINE) injection 5 mg  5 mg Intravenous Q2H PRN Lorretta HarpNiu, Xilin, MD      . Melene Muller[START ON 06/05/2019] levothyroxine (SYNTHROID) tablet 50 mcg  50 mcg Oral QAC breakfast Pahwani, Daleen Boavi, MD      . levothyroxine (SYNTHROID, LEVOTHROID) injection 25 mcg  25 mcg Intravenous Daily Lorretta HarpNiu, Xilin, MD   25 mcg at 06/04/19 [phone]  . ondansetron (ZOFRAN) injection 4 mg  4 mg Intravenous Q8H PRN Lorretta HarpNiu, Xilin, MD      . pantoprazole (PROTONIX) EC tablet 40 mg  40 mg Oral Daily Hughie ClossPahwani, Ravi, MD   40 mg at 06/04/19 1515  . propranolol (INDERAL) tablet 10 mg  10 mg Oral BID Hughie ClossPahwani, Ravi, MD       . QUEtiapine (SEROQUEL) tablet 25 mg  25 mg Oral  Dellis Filbert, MD   25 mg at 06/03/19 2101  . thiamine (VITAMIN B-1) tablet 100 mg  100 mg Oral Daily Greta Doom, MD   100 mg at 06/04/19 4496    Musculoskeletal: Strength & Muscle Tone: within normal limits Gait & Station: normal Patient leans: N/A  Psychiatric Specialty Exam: Physical Exam  Psychiatric: He has a normal mood and affect. His behavior is normal.    Review of Systems  Psychiatric/Behavioral: Positive for hallucinations.  All other systems reviewed and are negative.   Blood pressure 133/89, pulse (!) 111, temperature 98 F (36.7 C), resp. rate 12, height 5' 7.5" (1.715 m), weight 67 kg, SpO2 96 %.Body mass index is 22.79 kg/m.  General Appearance: Casual  Eye Contact:  Fair  Speech:  Clear and Coherent  Volume:  Normal  Mood:  Anxious and Depressed  Affect:  Depressed  Thought Process:  Disorganized  Orientation:  Other:  To person only   Thought Content:  Delusions and Hallucinations: Auditory  Suicidal Thoughts:  No  Homicidal Thoughts:  No  Memory:  Immediate;   Poor  Judgement:  Impaired  Insight:  Lacking  Psychomotor Activity:  NA  Concentration:  Concentration: Poor  Recall:  Poor  Fund of Knowledge:  Poor  Language:  Poor  Akathisia:  No  Handed:  Right  AIMS (if indicated):     Assets:  Communication Skills Desire for Improvement Physical Health Resilience Social Support  ADL's:  Intact  Cognition:  Impaired,  Mild  Sleep:        Treatment Plan Summary: Daily contact with patient to assess and evaluate symptoms and progress in treatment and Medication management   Consider medication adjustments:  Increase Seroquel 25 mg p.o. nightly to Seroquel 50 mg p.o. nightly Initiating Seroquel 25 mg p.o. twice daily Discontinuing follow-up benzodiazepine  Disposition: Recommend psychiatric Inpatient admission when medically cleared.  This service was provided via  telemedicine using a 2-way, interactive audio and video technology.  Names of all persons participating in this telemedicine service and their role in this encounter. Name: Michael Espinoza Role: patient   Name: T.Priest Lockridge Role: NP          Derrill Center, NP 06/04/2019 3:26 PM

## 2019-06-04 NOTE — Consult Note (Signed)
Awaiting IPad from RN

## 2019-06-04 NOTE — Evaluation (Signed)
Physical Therapy Evaluation Patient Details Name: Michael Espinoza MRN: [id number] DOB: 07/28/1956 Today's Date: 06/04/2019   History of Present Illness  63 yo male with onset of AMS was admitted with hallucinations, dx acute metabolic encephalopathy from possibly too much medication.  Confused and has been previously restrained, now able to get up with Covid (-) status.  Cleared for MRI and CT as well as EEG.  PMHx:  Xanax abuse, HTN, HLD, GERD, hypothyroidism, colitis, depression , R ankle surgery,   Clinical Impression  Pt was seen for mobility and strength assessment, and note his struggle with confusion and hallucination that family is in the room with him.  He is expected to make reasonable progress as he is willing to walk and move, but will need 1-2 person assist to safely manage his ataxia.  Follow acutely for these needs, and will continue on with balance skills as tolerated.    Follow Up Recommendations SNF    Equipment Recommendations  Rolling walker with 5" wheels    Recommendations for Other Services       Precautions / Restrictions Precautions Precautions: Fall Precaution Comments: confused, hallucinating Restrictions Weight Bearing Restrictions: No Other Position/Activity Restrictions: use care sitting up in chair as pt attempts to stand      Mobility  Bed Mobility Overal bed mobility: Needs Assistance Bed Mobility: Supine to Sit     Supine to sit: Min assist     General bed mobility comments: min assist more due to his focus on the task   Transfers Overall transfer level: Needs assistance Equipment used: Rolling walker (2 wheeled);1 person hand held assist Transfers: Sit to/from Stand Sit to Stand: Mod assist         General transfer comment: mod assist as pt is ataxic with transitional standing and cannot control balance on walker without cues for posture and correction with safety belt  Ambulation/Gait Ambulation/Gait assistance: Mod assist Gait  Distance (Feet): 5 Feet Assistive device: Rolling walker (2 wheeled);1 person hand held assist Gait Pattern/deviations: Step-to pattern;Decreased stride length;Shuffle;Ataxic;Narrow base of support;Trunk flexed Gait velocity: reduced Gait velocity interpretation: <1.8 ft/sec, indicate of risk for recurrent falls General Gait Details: pt had difficulty with both foot placement and static control of his limbs, misstepping and losing focus on the task with walker  Stairs            Wheelchair Mobility    Modified Rankin (Stroke Patients Only)       Balance Overall balance assessment: Needs assistance Sitting-balance support: Feet supported Sitting balance-Leahy Scale: Fair     Standing balance support: Bilateral upper extremity supported;During functional activity Standing balance-Leahy Scale: Poor                               Pertinent Vitals/Pain Pain Assessment: No/denies pain    Home Living Family/patient expects to be discharged to:: Private residence Living Arrangements: Spouse/significant other Available Help at Discharge: Family;Available 24 hours/day           Home Equipment: None Additional Comments: Pt is an unreliable historian, could not tell PT about his home situation    Prior Function Level of Independence: Independent(per pt)               Hand Dominance        Extremity/Trunk Assessment   Upper Extremity Assessment Upper Extremity Assessment: Overall WFL for tasks assessed    Lower Extremity Assessment Lower Extremity Assessment: Generalized weakness  Cervical / Trunk Assessment Cervical / Trunk Assessment: Normal  Communication   Communication: Receptive difficulties  Cognition Arousal/Alertness: Awake/alert Behavior During Therapy: Restless;Agitated;Impulsive Overall Cognitive Status: Impaired/Different from baseline Area of Impairment: Problem solving;Awareness;Safety/judgement;Following  commands;Memory;Attention;Orientation                 Orientation Level: Time;Situation Current Attention Level: Selective Memory: Decreased recall of precautions;Decreased short-term memory Following Commands: Follows one step commands inconsistently;Follows one step commands with increased time Safety/Judgement: Decreased awareness of safety;Decreased awareness of deficits Awareness: Intellectual Problem Solving: Slow processing;Difficulty sequencing;Requires verbal cues;Requires tactile cues General Comments: pt is perseverating on the lines and catheter      General Comments General comments (skin integrity, edema, etc.): pt is up to walk with assist, cannot plan his direction or visualize the set up with walker to sit in chair     Exercises     Assessment/Plan    PT Assessment Patient needs continued PT services  PT Problem List Decreased strength;Decreased range of motion;Decreased activity tolerance;Decreased balance;Decreased mobility;Decreased coordination;Decreased cognition;Decreased knowledge of use of DME;Decreased safety awareness;Cardiopulmonary status limiting activity       PT Treatment Interventions DME instruction;Gait training;Stair training;Functional mobility training;Therapeutic activities;Therapeutic exercise;Balance training;Neuromuscular re-education;Patient/family education    PT Goals (Current goals can be found in the Care Plan section)  Acute Rehab PT Goals Patient Stated Goal: none stated PT Goal Formulation: Patient unable to participate in goal setting Time For Goal Achievement: 06/18/19 Potential to Achieve Goals: Good    Frequency Min 2X/week   Barriers to discharge Other (comment) pt will be uncontrolled if attempting to stand and should start in rehab to reduce fall risk with wife at home    Co-evaluation               AM-PAC PT "6 Clicks" Mobility  Outcome Measure Help needed turning from your back to your side while in a  flat bed without using bedrails?: A Little Help needed moving from lying on your back to sitting on the side of a flat bed without using bedrails?: A Little Help needed moving to and from a bed to a chair (including a wheelchair)?: A Little Help needed standing up from a chair using your arms (e.g., wheelchair or bedside chair)?: A Lot Help needed to walk in hospital room?: A Lot Help needed climbing 3-5 steps with a railing? : Total 6 Click Score: 14    End of Session Equipment Utilized During Treatment: Gait belt Activity Tolerance: Patient tolerated treatment well;Treatment limited secondary to medical complications (Comment)(limited by AMS and ataxia) Patient left: in chair;with call bell/phone within reach;with chair alarm set Nurse Communication: Mobility status PT Visit Diagnosis: Unsteadiness on feet (R26.81);Other abnormalities of gait and mobility (R26.89);Difficulty in walking, not elsewhere classified (R26.2);Ataxic gait (R26.0)    Time: [id number] PT Time Calculation (min) (ACUTE ONLY): 27 min   Charges:   PT Evaluation $PT Eval Moderate Complexity: 1 Mod PT Treatments $Gait Training: 8-22 mins       Ivar DrapeRuth E Cornell [place]bon 06/04/2019, 12:48 PM   Samul Dadauth Dyesha Henault, PT MS Acute Rehab Dept. Number: [facility] [id number] and [facility] [phone]

## 2019-06-04 NOTE — Progress Notes (Addendum)
Noted recommendations from psychiatry.  Change Seroquel 50 mg nightly and 25 mg in the morning per psychiatry recommendations.  Also noted that they recommend inpatient psychiatry admission once medically cleared.  Called Ricky Ala, nurse practitioner with psychiatry and informed her that patient is medically stable to be admitted to psychiatry unit anytime.  She verbalized understanding and mentioned that she will work on finding a bed for him.

## 2019-06-05 LAB — CBC WITH DIFFERENTIAL/PLATELET
Abs Immature Granulocytes: 0.01 10*3/uL (ref 0.00–0.07)
Basophils Absolute: 0 10*3/uL (ref 0.0–0.1)
Basophils Relative: 1 %
Eosinophils Absolute: 0.3 10*3/uL (ref 0.0–0.5)
Eosinophils Relative: 5 %
HCT: 35 % — ABNORMAL LOW (ref 39.0–52.0)
Hemoglobin: 11.9 g/dL — ABNORMAL LOW (ref 13.0–17.0)
Immature Granulocytes: 0 %
Lymphocytes Relative: 25 %
Lymphs Abs: 1.5 10*3/uL (ref 0.7–4.0)
MCH: 31.1 pg (ref 26.0–34.0)
MCHC: 34 g/dL (ref 30.0–36.0)
MCV: 91.4 fL (ref 80.0–100.0)
Monocytes Absolute: 0.8 10*3/uL (ref 0.1–1.0)
Monocytes Relative: 14 %
Neutro Abs: 3.3 10*3/uL (ref 1.7–7.7)
Neutrophils Relative %: 55 %
Platelets: 251 10*3/uL (ref 150–400)
RBC: 3.83 MIL/uL — ABNORMAL LOW (ref 4.22–5.81)
RDW: 12 % (ref 11.5–15.5)
WBC: 5.8 10*3/uL (ref 4.0–10.5)
nRBC: 0 % (ref 0.0–0.2)

## 2019-06-05 LAB — METHYLMALONIC ACID, SERUM: Methylmalonic Acid, Quantitative: 61 nmol/L (ref 0–378)

## 2019-06-05 MED ORDER — FAMOTIDINE 20 MG PO TABS: 20.0000 mg | ORAL_TABLET | Freq: Two times a day (BID) | ORAL | Status: DC

## 2019-06-05 MED ORDER — FAMOTIDINE 20 MG PO TABS
20.0000 mg | ORAL_TABLET | Freq: Two times a day (BID) | ORAL | Status: DC
  Administered 2019-06-05 – 2019-06-12 (×15): 20 mg via ORAL
  Filled 2019-06-05 (×15): qty 1

## 2019-06-05 NOTE — Clinical Social Work Note (Signed)
CSW received consult from MD regarding psych placement needed. Notes reviewed and call made to [facility] and referral made. Per intake person, patient information will be reviewed and CSW will be contacted on Wed. 8/12.  Seleny Allbright Givens, MSW, LCSW Licensed Clinical Social Worker [place] [phone]

## 2019-06-05 NOTE — Progress Notes (Signed)
PROGRESS NOTE    Michael CopasConnie Thornton  ZOX:[id number] DOB: 04/04/1956 DOA: 06/01/2019 PCP: Roanna RaiderBuno, [name], PA   Brief Narrative:  Michael Espinoza is a 63 y.o. male with medical history significant of former Xanax abuse, hypertension, hyperlipidemia, GERD, hypothyroidism, depression, colitis, who presented to OSH [facility]) with altered mental status, hallucinations and agitation on 05/29/2019.  Per report, patient had a history of Xanax addiction that was tapered and discontinued 2014, and has since been clean. Recently due to lower back pain, patient was seeing chiropractor.  He has been taking Flexeril initially 10 mg 3 times per day, but the patient admitted that that he has been taking up to 6 pills/day.  Since then he was noted by his wife to be confused, with hallucination and agitation.  Patient stopped taking Flexeril, but his mental status has not improved.   No history of alcohol abuse, recreational drug or illicit drug use.  No unilateral weakness, numbness in extremities.  No facial droop or slurred speech.  No chest pain, shortness breath, cough.  No nausea vomiting, diarrhea, abdominal pain, symptoms of UTI.  Pt was treated with prn zypexa and Geodon in another hospital. CT head and MRI brain had no acute findings. Urine drug screen is negative. Patient had negative urinalysis, vitamin B 12 level 462 which is normal, negative COVID-19 test, negative chest x-ray.  Due to lack of neurology and psychiatry services, patient was [facility] to [facility].  Neurology was consulted.  No further imaging studies were done here.  All his benzodiazepines were held to wait for his body to clear up benzodiazepines.  He has improved significantly since then.  He is alert and oriented x4 however he continues to have visual as well as auditory hallucinations.  Assessment & Plan:   Principal Problem:   Acute metabolic encephalopathy Active Problems:   Hypertension   Hypothyroidism   HLD (hyperlipidemia)   GERD (gastroesophageal reflux disease)   Depression   Acute metabolic encephalopathy/hallucinations: Likely due to Flexeril withdrawal with possible underlying dementia.  Had negative CT head and MRI.  Neurology on board.  Doing much better today now that he is alert and oriented x4 but is still having hallucinations.  Seen by psychiatry yesterday.  Increased Seroquel.  [facility]  Waiting for bed.  Hypertension: Reportedly he is on Cozaar, amlodipine and propranolol at home.  Blood pressure much better.  Continue current management.  Hypothyroidism: Continue Synthroid.  Hyperlipidemia: Hold Lipitor until able to take p.o.  GERD: Continue IV Pepcid.  Hypokalemia: Resolved.  Hypoglycemia/dysphagia due to lethargy: Blood sugar control.   DVT prophylaxis: Lovenox Code Status: Full code Family Communication: Called and discussed with his wife Britta MccreedyBarbara and updated her today.  Answered several questions.  Also updated her about psychiatry recommendations.   Disposition Plan[facility].  Subjective: Patient seen and examined.  He was alert and oriented x4 but again he was having hallucinations while I was there.  He did not have any complaint.  Objective: Vitals:   06/04/19 1555 06/04/19 2215 06/05/19 0613 06/05/19 1443  BP: 115/78 (!) 145/96 (!) 134/93 (!) 123/93  Pulse:  99 77 82  Resp: 16 18 18 18   Temp:  98.2 F (36.8 C) 97.7 F (36.5 C) 98.3 F (36.8 C)  TempSrc:  Oral Oral Oral  SpO2:  95% 97% 97%  Weight:      Height:  Intake/Output Summary (Last 24 hours) at 06/05/2019 1638 Last data filed at 06/05/2019 1047 Gross per 24 hour  Intake 480 ml  Output 2775 ml  Net -2295 ml   Filed Weights   06/01/19 1041 06/02/19 0352  Weight: 65.8 kg 67 kg    Consultants:   Neurology  Psychiatry  Procedures:   None  Antimicrobials:   None  Objective:  Vitals:   06/04/19 1555 06/04/19 2215 06/05/19 0613 06/05/19 1443  BP: 115/78 (!) 145/96 (!) 134/93 (!) 123/93  Pulse:  99 77 82  Resp: 16 18 18 18   Temp:  98.2 F (36.8 C) 97.7 F (36.5 C) 98.3 F (36.8 C)  TempSrc:  Oral Oral Oral  SpO2:  95% 97% 97%  Weight:      Height:        Intake/Output Summary (Last 24 hours) at 06/05/2019 1638 Last data filed at 06/05/2019 1047 Gross per 24 hour  Intake 480 ml  Output 2775 ml  Net -2295 ml   Filed Weights   06/01/19 1041 06/02/19 0352  Weight: 65.8 kg 67 kg    Examination:  General exam: Appears calm and comfortable  Respiratory system: Clear to auscultation. Respiratory effort normal. Cardiovascular system: S1 & S2 heard, RRR. No JVD, murmurs, rubs, gallops or clicks. No pedal edema. Gastrointestinal system: Abdomen is nondistended, soft and nontender. No organomegaly or masses felt. Normal bowel sounds heard. Central nervous system: Alert and oriented. No focal neurological deficits. Extremities: Symmetric 5 x 5 power. Skin: No rashes, lesions or ulcers Psychiatry: Judgement and insight appear poor. Mood & affect appropriate.     Data Reviewed: I have personally reviewed following labs and imaging studies  CBC: Recent Labs  Lab 06/01/19 0442 06/02/19 0745 06/03/19 0740 06/04/19 0333 06/05/19 0256  WBC 9.1 6.7 5.6 4.7 5.8  NEUTROABS  --  5.1 4.0 2.8 3.3  HGB 12.1* 11.5* 12.0* 11.4* 11.9*  HCT 35.6* 33.3* 36.1* 33.9* 35.0*  MCV 90.6 90.0 93.0 92.1 91.4  PLT 209 193 212 198 833   Basic Metabolic Panel: Recent Labs  Lab 06/01/19 0442 06/02/19 0745 06/03/19 0740 06/04/19 0333  NA 140 142 141 140  K 2.8* 3.3* 3.9 3.8  CL 106 110 108 108  CO2 21* 22 24 23   GLUCOSE 87 116* 130* 111*  BUN 7* <5* <5* <5*  CREATININE 0.80 0.58* 0.56* 0.60*  CALCIUM 8.8* 8.9 9.3 9.1  MG  --  1.7 1.8  --    GFR: Estimated Creatinine Clearance: 88.4 mL/min (A) (by C-G formula based on SCr of 0.6 mg/dL (L)). Liver Function  Tests: Recent Labs  Lab 06/02/19 0745 06/03/19 0740 06/04/19 0333  AST 35 36 28  ALT 28 29 26   ALKPHOS 79 79 77  BILITOT 2.1* 1.8* 1.3*  PROT 5.8* 6.2* 5.9*  ALBUMIN 3.5 3.4* 3.3*   No results for input(s): LIPASE, AMYLASE in the last 168 hours. Recent Labs  Lab 06/03/19 1356  AMMONIA 10   Coagulation Profile: No results for input(s): INR, PROTIME in the last 168 hours. Cardiac Enzymes: No results for input(s): CKTOTAL, CKMB, CKMBINDEX, TROPONINI in the last 168 hours. BNP (last 3 results) No results for input(s): PROBNP in the last 8760 hours. HbA1C: No results for input(s): HGBA1C in the last 72 hours. CBG: Recent Labs  Lab 06/02/19 2033 06/03/19 0008 06/03/19 0431 06/03/19 0828 06/03/19 1232  GLUCAP 109* 121* 112* 116* 108*   Lipid Profile: No results for input(s): CHOL, HDL, LDLCALC, TRIG, CHOLHDL, LDLDIRECT  in the last 72 hours. Thyroid Function Tests: Recent Labs    06/03/19 1356  TSH 3.193   Anemia Panel: No results for input(s): VITAMINB12, FOLATE, FERRITIN, TIBC, IRON, RETICCTPCT in the last 72 hours. Sepsis Labs: Recent Labs  Lab 06/02/19 0745  PROCALCITON <0.10    No results found for this or any previous visit (from the past 240 hour(s)).    Radiology Studies: No results found.  Scheduled Meds: . amLODipine  5 mg Oral Daily  . aspirin EC  81 mg Oral Daily  . atorvastatin  10 mg Oral Daily  . cholestyramine  1 packet Oral Daily  . enoxaparin (LOVENOX) injection  40 mg Subcutaneous Q24H  . famotidine  20 mg Oral BID  . levothyroxine  50 mcg Oral QAC breakfast  . levothyroxine  25 mcg Intravenous Daily  . pantoprazole  40 mg Oral Daily  . propranolol  10 mg Oral BID  . QUEtiapine  25 mg Oral Daily  . QUEtiapine  50 mg Oral QHS  . thiamine  100 mg Oral Daily   Continuous Infusions:    LOS: 4 days   Time spent: 28 minutes   Hughie Clossavi Kele Withem, MD [facility] Pager [phone]  If 7PM-7AM, please contact night-coverage  [email or web address] Password [email or web address] 06/05/2019, 4:38 PM

## 2019-06-05 NOTE — Progress Notes (Signed)
Pt with mild agitation with vigorous movement in bed earlier in the shift. Pt continues to have visual hallucinations. Pt reports seeing people behind the walls.  Staff continues to Reorient / Redirect pt. PRN haldol given with minimal relief

## 2019-06-05 NOTE — Progress Notes (Signed)
Pt more confused; seeing family members and other things that are not there. Difficult to redirect. Ambulating better and more steadily - ambulated 1269ft with this RN. Voided in bathroom. Bed alarm on.

## 2019-06-05 NOTE — Progress Notes (Addendum)
Pt up in chair, has been ambulating in room. Sitter at bedside. Pt reoriented but is confused, does not remember visit from wife yesterday, verbalizes doubt she will come back. Difficult to redirect patient. Will continue to monitor closely and maintain safety.  0845 - oriented more when speaking with MD (knows hospital but not [facility], knows year and month and president but not date).  1040 - Wife at bedside

## 2019-06-05 NOTE — Progress Notes (Signed)
Pt with increased confusing & agitation. Pt states seeing her grand daughter , Michael Espinoza , spraying water in  the floor. Unable to redirect/ reorient pt. PRN haldol given

## 2019-06-06 LAB — CBC WITH DIFFERENTIAL/PLATELET
Abs Immature Granulocytes: 0.03 10*3/uL (ref 0.00–0.07)
Basophils Absolute: 0 10*3/uL (ref 0.0–0.1)
Basophils Relative: 1 %
Eosinophils Absolute: 0.3 10*3/uL (ref 0.0–0.5)
Eosinophils Relative: 5 %
HCT: 37.8 % — ABNORMAL LOW (ref 39.0–52.0)
Hemoglobin: 12.7 g/dL — ABNORMAL LOW (ref 13.0–17.0)
Immature Granulocytes: 1 %
Lymphocytes Relative: 19 %
Lymphs Abs: 1.2 10*3/uL (ref 0.7–4.0)
MCH: 30.9 pg (ref 26.0–34.0)
MCHC: 33.6 g/dL (ref 30.0–36.0)
MCV: 92 fL (ref 80.0–100.0)
Monocytes Absolute: 0.8 10*3/uL (ref 0.1–1.0)
Monocytes Relative: 12 %
Neutro Abs: 3.9 10*3/uL (ref 1.7–7.7)
Neutrophils Relative %: 62 %
Platelets: 260 10*3/uL (ref 150–400)
RBC: 4.11 MIL/uL — ABNORMAL LOW (ref 4.22–5.81)
RDW: 12 % (ref 11.5–15.5)
WBC: 6.2 10*3/uL (ref 4.0–10.5)
nRBC: 0 % (ref 0.0–0.2)

## 2019-06-06 MED ORDER — ADULT MULTIVITAMIN W/MINERALS CH
1.0000 | ORAL_TABLET | Freq: Every day | ORAL | Status: DC
  Administered 2019-06-06 – 2019-06-12 (×7): 1 via ORAL
  Filled 2019-06-06 (×7): qty 1

## 2019-06-06 MED ORDER — ENSURE ENLIVE PO LIQD
237.0000 mL | Freq: Three times a day (TID) | ORAL | Status: DC
  Administered 2019-06-06 – 2019-06-11 (×16): 237 mL via ORAL
  Filled 2019-06-06: qty 237

## 2019-06-06 NOTE — Progress Notes (Signed)
CSW called and left a message with [facility] disposition office inquiring about a bed for the patient. CSW is awaiting a return phone call.   CSW will continue to follow and assist with disposition placement.   Domenic Schwab, MSW, [place] Worker [facility]  [phone]

## 2019-06-06 NOTE — Progress Notes (Signed)
CSW contacted Otila Kluver at [facility]. They do not have any beds available this week. CSW asked Otila Kluver if [facility] had any beds available. She stated that they had some this morning.   CSW called Shalita at [facility], she stated that they had beds available. She took the referral and stated that she would let the CSW know if they were able to accept him.   CSW called Dr. Doristine Bosworth and provided a current update.   CSW will continue to follow and assist as needed.   Domenic Schwab, MSW, [place] Worker [facility]  [phone]

## 2019-06-06 NOTE — Progress Notes (Signed)
Page to MD for nurse. Patient becoming agitated and slightly combative. Patient jumping in and out of bed. Redirectable at times, though more confused.

## 2019-06-06 NOTE — Progress Notes (Signed)
Nutrition Follow-up  RD working remotely.  DOCUMENTATION CODES:   Not applicable  INTERVENTION:   -MVI with minerals daily -Ensure Enlive po TID, each supplement provides 350 kcal and 20 grams of protein -Magic cup TID with meals, each supplement provides 290 kcal and 9 grams of protein  NUTRITION DIAGNOSIS:   Inadequate oral intake related to lethargy/confusion as evidenced by meal completion < 50%  Ongoing  GOAL:   Patient will meet greater than or equal to 90% of their needs  Progressing   MONITOR:   PO intake, Supplement acceptance, Labs, Weight trends, Skin, I & O's  REASON FOR ASSESSMENT:   Low Braden    ASSESSMENT:   Michael Espinoza is a 63 y.o. male with medical history significant of former Xanax abuse, hypertension, hyperlipidemia, GERD, hypothyroidism, depression, colitis, who presents with altered mental status.  8/8- s/p BSE- advanced to full liquid diet 8/9- s/p BSE- advanced to regular diet  Reviewed I/O's: -320 ml x 24 hours and +854 ml since admission  UOP: 800 ml x 24 hours   Pt with intermittent confusion and agitation and has been experiencing both auditory and visual hallucinations. Psychiatry following and is medically cleared for discharge. Plan for inpatient psychiatric hospitalization once bed is available.   Per RN notes, pt is able to feed himself. Pt with poor oral intake; noted meal completion 25-50%. Pt would greatly benefit from addition of oral nutrition supplements.   Medications reviewed and include thiamine.   Labs reviewed: CBGS: 108.   Diet Order:   Diet Order            Diet regular Room service appropriate? No; Fluid consistency: Thin  Diet effective now              EDUCATION NEEDS:   Not appropriate for education at this time  Skin:  Skin Assessment: Reviewed RN Assessment  Last BM:  06/05/19  Height:   Ht Readings from Last 1 Encounters:  06/01/19 5' 7.5" (1.715 m)    Weight:   Wt Readings from  Last 1 Encounters:  06/06/19 61.2 kg    Ideal Body Weight:  68.6 kg  BMI:  Body mass index is 20.82 kg/m.  Estimated Nutritional Needs:   Kcal:  8115-7262  Protein:  85-100 grams  Fluid:  1.7-1.9 L    Jaquaveon Bilal A. Jimmye Norman, RD, LDN, [place] Registered Dietitian II Certified Diabetes Care and Education Specialist Pager: [phone] After hours Pager: [phone]

## 2019-06-06 NOTE — Progress Notes (Signed)
PROGRESS NOTE    Michael CopasConnie Catena  ZOX:[id number] DOB: 05/24/1956 DOA: 06/01/2019 PCP: Roanna RaiderBuno, [name], PA   Brief Narrative:  Michael Espinoza is a 63 y.o. male with medical history significant of former Xanax abuse, hypertension, hyperlipidemia, GERD, hypothyroidism, depression, colitis, who presented to OSH [facility]) with altered mental status, hallucinations and agitation on 05/29/2019.  Per report, patient had a history of Xanax addiction that was tapered and discontinued 2014, and has since been clean. Recently due to lower back pain, patient was seeing chiropractor.  He has been taking Flexeril initially 10 mg 3 times per day, but the patient admitted that that he has been taking up to 6 pills/day.  Since then he was noted by his wife to be confused, with hallucination and agitation.  Patient stopped taking Flexeril, but his mental status has not improved.   No history of alcohol abuse, recreational drug or illicit drug use.  No unilateral weakness, numbness in extremities.  No facial droop or slurred speech.  No chest pain, shortness breath, cough.  No nausea vomiting, diarrhea, abdominal pain, symptoms of UTI.  Pt was treated with prn zypexa and Geodon in another hospital. CT head and MRI brain had no acute findings. Urine drug screen is negative. Patient had negative urinalysis, vitamin B 12 level 462 which is normal, negative COVID-19 test, negative chest x-ray.  Due to lack of neurology and psychiatry services, patient was transferred to [facility].  Neurology was consulted.  No further imaging studies were done here.  All his benzodiazepines were held to wait for his body to clear up benzodiazepines.  He has improved significantly since then.  He is alert and oriented x4 however he continues to have visual as well as auditory hallucinations.  He was evaluated by psychiatry and they recommended inpatient psychiatry admission.  We are waiting for bed.  Assessment & Plan:    Principal Problem:   Acute metabolic encephalopathy Active Problems:   Hypertension   Hypothyroidism   HLD (hyperlipidemia)   GERD (gastroesophageal reflux disease)   Depression   Acute metabolic encephalopathy/hallucinations: Likely due to Flexeril withdrawal with possible underlying dementia.  Had negative CT head and MRI.  Neurology on board.  Doing much better today now that he is alert and oriented x4 but is still having hallucinations.  Seen by psychiatry yesterday.  Increased Seroquel.  [facility]  Waiting for bed.  Hypertension: Reportedly he is on Cozaar, amlodipine and propranolol at home.  All of them were on hold due to low blood pressure but now that blood pressure is much better we had resumed his amlodipine 5 mg which I will continue but continue to hold other medications.  Hypothyroidism: Continue Synthroid.  Hyperlipidemia: Hold Lipitor until able to take p.o.  GERD: Continue IV Pepcid.  Hypokalemia: Resolved.  Hypoglycemia/dysphagia due to lethargy: Blood sugar controlled.   DVT prophylaxis: Lovenox Code Status: Full code Family Communication: Called and discussed with his wife Britta MccreedyBarbara and updated her today.  Answered several questions.  Also updated her about psychiatry recommendations.   Disposition Plan[facility].  Subjective: Patient seen and examined.  He was alert and oriented x3.  Did not have any complaint.  Seemed to think that he was at his workplace.  Objective: Vitals:   06/05/19 2139 06/06/19 0348 06/06/19 0634 06/06/19 1359  BP: 91/69  129/87 (!) 147/95  Pulse: 75  87 87  Resp: 18  18 20  Temp: 97.6 F (36.4 C)  98.1 F (36.7 C) (!) 97.5 F (36.4 C)  TempSrc: Oral  Oral Oral  SpO2: 90%  96% 100%  Weight:  61.2 kg    Height:        Intake/Output Summary (Last 24 hours) at 06/06/2019 1541 Last data filed at 06/06/2019 1400 Gross per 24 hour  Intake 360  ml  Output -  Net 360 ml   Filed Weights   06/01/19 1041 06/02/19 0352 06/06/19 0348  Weight: 65.8 kg 67 kg 61.2 kg    Consultants:   Neurology  Psychiatry  Procedures:   None  Antimicrobials:   None  Objective: Vitals:   06/05/19 2139 06/06/19 0348 06/06/19 0634 06/06/19 1359  BP: 91/69  129/87 (!) 147/95  Pulse: 75  87 87  Resp: 18  18 20   Temp: 97.6 F (36.4 C)  98.1 F (36.7 C) (!) 97.5 F (36.4 C)  TempSrc: Oral  Oral Oral  SpO2: 90%  96% 100%  Weight:  61.2 kg    Height:        Intake/Output Summary (Last 24 hours) at 06/06/2019 1541 Last data filed at 06/06/2019 1400 Gross per 24 hour  Intake 360 ml  Output -  Net 360 ml   Filed Weights   06/01/19 1041 06/02/19 0352 06/06/19 0348  Weight: 65.8 kg 67 kg 61.2 kg    Examination:  General exam: Appears calm and comfortable  Respiratory system: Clear to auscultation. Respiratory effort normal. Cardiovascular system: S1 & S2 heard, RRR. No JVD, murmurs, rubs, gallops or clicks. No pedal edema. Gastrointestinal system: Abdomen is nondistended, soft and nontender. No organomegaly or masses felt. Normal bowel sounds heard. Central nervous system: Alert and oriented x3. No focal neurological deficits. Extremities: Symmetric 5 x 5 power. Skin: No rashes, lesions or ulcers Psychiatry: Judgement and insight appear poor. Mood & affect flat  Data Reviewed: I have personally reviewed following labs and imaging studies  CBC: Recent Labs  Lab 06/02/19 0745 06/03/19 0740 06/04/19 0333 06/05/19 0256 06/06/19 0606  WBC 6.7 5.6 4.7 5.8 6.2  NEUTROABS 5.1 4.0 2.8 3.3 3.9  HGB 11.5* 12.0* 11.4* 11.9* 12.7*  HCT 33.3* 36.1* 33.9* 35.0* 37.8*  MCV 90.0 93.0 92.1 91.4 92.0  PLT 193 212 198 251 024   Basic Metabolic Panel: Recent Labs  Lab 06/01/19 0442 06/02/19 0745 06/03/19 0740 06/04/19 0333  NA 140 142 141 140  K 2.8* 3.3* 3.9 3.8  CL 106 110 108 108  CO2 21* 22 24 23   GLUCOSE 87 116* 130* 111*   BUN 7* <5* <5* <5*  CREATININE 0.80 0.58* 0.56* 0.60*  CALCIUM 8.8* 8.9 9.3 9.1  MG  --  1.7 1.8  --    GFR: Estimated Creatinine Clearance: 80.8 mL/min (A) (by C-G formula based on SCr of 0.6 mg/dL (L)). Liver Function Tests: Recent Labs  Lab 06/02/19 0745 06/03/19 0740 06/04/19 0333  AST 35 36 28  ALT 28 29 26   ALKPHOS 79 79 77  BILITOT 2.1* 1.8* 1.3*  PROT 5.8* 6.2* 5.9*  ALBUMIN 3.5 3.4* 3.3*   No results for input(s): LIPASE, AMYLASE in the last 168 hours. Recent Labs  Lab 06/03/19 1356  AMMONIA 10   Coagulation Profile: No results for input(s): INR, PROTIME in the last 168 hours. Cardiac Enzymes: No results for input(s): CKTOTAL, CKMB, CKMBINDEX, TROPONINI in the last 168 hours. BNP (last 3 results) No results for input(s): PROBNP in the last 8760  hours. HbA1C: No results for input(s): HGBA1C in the last 72 hours. CBG: Recent Labs  Lab 06/02/19 2033 06/03/19 0008 06/03/19 0431 06/03/19 0828 06/03/19 1232  GLUCAP 109* 121* 112* 116* 108*   Lipid Profile: No results for input(s): CHOL, HDL, LDLCALC, TRIG, CHOLHDL, LDLDIRECT in the last 72 hours. Thyroid Function Tests: No results for input(s): TSH, T4TOTAL, FREET4, T3FREE, THYROIDAB in the last 72 hours. Anemia Panel: No results for input(s): VITAMINB12, FOLATE, FERRITIN, TIBC, IRON, RETICCTPCT in the last 72 hours. Sepsis Labs: Recent Labs  Lab 06/02/19 0745  PROCALCITON <0.10    No results found for this or any previous visit (from the past 240 hour(s)).    Radiology Studies: No results found.  Scheduled Meds: . amLODipine  5 mg Oral Daily  . aspirin EC  81 mg Oral Daily  . atorvastatin  10 mg Oral Daily  . cholestyramine  1 packet Oral Daily  . enoxaparin (LOVENOX) injection  40 mg Subcutaneous Q24H  . famotidine  20 mg Oral BID  . feeding supplement (ENSURE ENLIVE)  237 mL Oral TID BM  . levothyroxine  50 mcg Oral QAC breakfast  . levothyroxine  25 mcg Intravenous Daily  . multivitamin  with minerals  1 tablet Oral Daily  . pantoprazole  40 mg Oral Daily  . propranolol  10 mg Oral BID  . QUEtiapine  25 mg Oral Daily  . QUEtiapine  50 mg Oral QHS  . thiamine  100 mg Oral Daily   Continuous Infusions:    LOS: 5 days   Time spent: 25 minutes   Hughie Clossavi Anamae Rochelle, MD [facility] Pager [phone]  If 7PM-7AM, please contact night-coverage [email or web address] Password [email or web address] 06/06/2019, 3:41 PM

## 2019-06-07 LAB — BASIC METABOLIC PANEL
Anion gap: 12 (ref 5–15)
BUN: 10 mg/dL (ref 8–23)
CO2: 25 mmol/L (ref 22–32)
Calcium: 10.3 mg/dL (ref 8.9–10.3)
Chloride: 102 mmol/L (ref 98–111)
Creatinine, Ser: 0.7 mg/dL (ref 0.61–1.24)
GFR calc Af Amer: >60 mL/min (ref >60)
GFR calc non Af Amer: >60 mL/min (ref >60)
Glucose, Bld: 107 mg/dL — ABNORMAL HIGH (ref 70–99)
Potassium: 3.6 mmol/L (ref 3.5–5.1)
Sodium: 139 mmol/L (ref 135–145)

## 2019-06-07 MED ORDER — TERBINAFINE HCL 1 % EX CREA
TOPICAL_CREAM | Freq: Two times a day (BID) | CUTANEOUS | Status: DC
  Administered 2019-06-07 – 2019-06-12 (×11): via TOPICAL
  Filled 2019-06-07 (×3): qty 12

## 2019-06-07 NOTE — Progress Notes (Signed)
   06/06/19  2129   Bilateral soft wrist restraints initiated per order.   Pt with  Agitation / restlessness. Pt with multiple times to  exit the bed  triggering  bed alarm . Pt refusing to go back to bed stating he wants to go home. Pt with visual hallucinations . Pt noted to be  talking with  his wife  and reports seeing people / objects in the room.  Haldol 2mg  given @ 1904 with no relief. Difficult to Redirect/ Reorient pt.  Pt  with fluctuating LOC. Pt Alert to Self, atimes Alert to Place.  Restraints were applied correctly with 2 finger access to ensure they aren't not too tight. Unable to reach pt's wife, Aarib Pulido and left voice  Mail msg.  Restraints education provided but unable to access pt for understanding . To continue to Redirect/ Reassess pt and assess need for discontinuing restraints. Pt with no s/s of distress

## 2019-06-07 NOTE — Progress Notes (Signed)
PROGRESS NOTE    Michael Espinoza  WUJ:[id number] DOB: 02/16/1956 DOA: 06/01/2019 PCP: Roanna RaiderBuno, Michael R, PA   Brief Narrative:  Michael Espinoza is a 63 y.o. male with medical history significant of former Xanax abuse, hypertension, hyperlipidemia, GERD, hypothyroidism, depression, colitis, who presented to [facility]) with altered mental status, hallucinations and agitation on 05/29/2019.  Per report, patient had a history of Xanax addiction that was tapered and discontinued 2014, and has since been clean. Recently due to lower back pain, patient was seeing chiropractor.  He has been taking Flexeril initially 10 mg 3 times per day, but the patient admitted that that he has been taking up to 6 pills/day.  Since then he was noted by his wife to be confused, with hallucination and agitation.  Patient stopped taking Flexeril, but his mental status has not improved.   No history of alcohol abuse, recreational drug or illicit drug use.  No unilateral weakness, numbness in extremities.  No facial droop or slurred speech.  No chest pain, shortness breath, cough.  No nausea vomiting, diarrhea, abdominal pain, symptoms of UTI.  Pt was treated with prn zypexa and Geodon in another hospital. CT head and MRI brain had no acute findings. Urine drug screen is negative. Patient had negative urinalysis, vitamin B 12 level 462 which is normal, negative COVID-19 test, negative chest x-ray.  Due to lack of neurology and psychiatry services, patient was transferred [facility].  Neurology was consulted.  No further imaging studies were done here.  All his benzodiazepines were held to wait for his body to clear up benzodiazepines.  He has improved significantly since then.  He is alert and oriented x4 however he continues to have visual as well as auditory hallucinations.  He was evaluated by psychiatry and they recommended inpatient psychiatry admission.  We are waiting for bed.  Assessment & Plan:    Principal Problem:   Acute metabolic encephalopathy Active Problems:   Hypertension   Hypothyroidism   HLD (hyperlipidemia)   GERD (gastroesophageal reflux disease)   Depression   Acute metabolic encephalopathy/hallucinations: Likely due to Flexeril withdrawal with possible underlying dementia.  Had negative CT head and MRI.  Neurology on board.  Doing much better today now that he is alert and oriented x4 but is still having hallucinations.  Seen by psychiatry yesterday.  Increased Seroquel per psychiatry recommendations.  [facility]  Waiting for bed.  Hypertension: Reportedly he is on Cozaar, amlodipine and propranolol at home.  All of them were on hold due to low blood pressure but now that blood pressure is much better we had resumed his amlodipine 5 mg which I will continue but continue to hold other medications.  Hypothyroidism: Continue Synthroid.  Hyperlipidemia: Hold Lipitor until able to take p.o.  GERD: Continue IV Pepcid.  Hypokalemia: Resolved.  Hypoglycemia/dysphagia due to lethargy: Blood sugar controlled.   Tinea corporis: He has 3 different lesions of tinea corporis in the right axilla and one on the right side of groin.  Will start on terbinafine cream twice daily for 7 days.  DVT prophylaxis: Lovenox Code Status: Full code Family Communication: Discussed with his wife Michael Espinoza at the bedside.  Answered all the questions. Disposition Plan: [facility] per [facility].  Subjective: Patient seen and examined.  He is alert and oriented x4.  Has no complaints.  Wants to go home.  Objective: Vitals:   06/07/19 [id number] 06/07/19 0418 06/07/19 [id number]  06/07/19 1235  BP: 116/85 116/85 117/87 106/84  Pulse: 80 80 79 92  Resp: 18 18 14 16   Temp: 97.8 F (36.6 C) 97.8 F (36.6 C) 98.4 F (36.9 C) 97.6 F (36.4 C)  TempSrc:  Oral Oral Oral  SpO2: 96% 95% 95% 96%  Weight:      Height:         Intake/Output Summary (Last 24 hours) at 06/07/2019 1340 Last data filed at 06/07/2019 0917 Gross per 24 hour  Intake 220 ml  Output -  Net 220 ml   Filed Weights   06/01/19 1041 06/02/19 0352 06/06/19 0348  Weight: 65.8 kg 67 kg 61.2 kg    Consultants:   Neurology  Psychiatry  Procedures:   None  Antimicrobials:   None  Objective: Vitals:   06/07/19 0417 06/07/19 0418 06/07/19 0829 06/07/19 1235  BP: 116/85 116/85 117/87 106/84  Pulse: 80 80 79 92  Resp: 18 18 14 16   Temp: 97.8 F (36.6 C) 97.8 F (36.6 C) 98.4 F (36.9 C) 97.6 F (36.4 C)  TempSrc:  Oral Oral Oral  SpO2: 96% 95% 95% 96%  Weight:      Height:        Intake/Output Summary (Last 24 hours) at 06/07/2019 1340 Last data filed at 06/07/2019 0917 Gross per 24 hour  Intake 220 ml  Output -  Net 220 ml   Filed Weights   06/01/19 1041 06/02/19 0352 06/06/19 0348  Weight: 65.8 kg 67 kg 61.2 kg    Examination:  General exam: Appears calm and comfortable  Respiratory system: Clear to auscultation. Respiratory effort normal. Cardiovascular system: S1 & S2 heard, RRR. No JVD, murmurs, rubs, gallops or clicks. No pedal edema. Gastrointestinal system: Abdomen is nondistended, soft and nontender. No organomegaly or masses felt. Normal bowel sounds heard. Central nervous system: Alert and oriented x4. No focal neurological deficits. Extremities: Symmetric 5 x 5 power. Skin: Round and oval erythematous lesions with good demarcation, 3 in the right axilla and 1 in right groin. Psychiatry: Judgement and insight appear poor. Mood & affect flat  Data Reviewed: I have personally reviewed following labs and imaging studies  CBC: Recent Labs  Lab 06/02/19 0745 06/03/19 0740 06/04/19 0333 06/05/19 0256 06/06/19 0606  WBC 6.7 5.6 4.7 5.8 6.2  NEUTROABS 5.1 4.0 2.8 3.3 3.9  HGB 11.5* 12.0* 11.4* 11.9* 12.7*  HCT 33.3* 36.1* 33.9* 35.0* 37.8*  MCV 90.0 93.0 92.1 91.4 92.0  PLT 193 212 198 251 035    Basic Metabolic Panel: Recent Labs  Lab 06/01/19 0442 06/02/19 0745 06/03/19 0740 06/04/19 0333 06/07/19 0312  NA 140 142 141 140 139  K 2.8* 3.3* 3.9 3.8 3.6  CL 106 110 108 108 102  CO2 21* 22 24 23 25   GLUCOSE 87 116* 130* 111* 107*  BUN 7* <5* <5* <5* 10  CREATININE 0.80 0.58* 0.56* 0.60* 0.70  CALCIUM 8.8* 8.9 9.3 9.1 10.3  MG  --  1.7 1.8  --   --    GFR: Estimated Creatinine Clearance: 80.8 mL/min (by C-G formula based on SCr of 0.7 mg/dL). Liver Function Tests: Recent Labs  Lab 06/02/19 0745 06/03/19 0740 06/04/19 0333  AST 35 36 28  ALT 28 29 26   ALKPHOS 79 79 77  BILITOT 2.1* 1.8* 1.3*  PROT 5.8* 6.2* 5.9*  ALBUMIN 3.5 3.4* 3.3*   No results for input(s): LIPASE, AMYLASE in the last 168 hours. Recent Labs  Lab 06/03/19 1356  AMMONIA 10  Coagulation Profile: No results for input(s): INR, PROTIME in the last 168 hours. Cardiac Enzymes: No results for input(s): CKTOTAL, CKMB, CKMBINDEX, TROPONINI in the last 168 hours. BNP (last 3 results) No results for input(s): PROBNP in the last 8760 hours. HbA1C: No results for input(s): HGBA1C in the last 72 hours. CBG: Recent Labs  Lab 06/02/19 2033 06/03/19 0008 06/03/19 0431 06/03/19 0828 06/03/19 1232  GLUCAP 109* 121* 112* 116* 108*   Lipid Profile: No results for input(s): CHOL, HDL, LDLCALC, TRIG, CHOLHDL, LDLDIRECT in the last 72 hours. Thyroid Function Tests: No results for input(s): TSH, T4TOTAL, FREET4, T3FREE, THYROIDAB in the last 72 hours. Anemia Panel: No results for input(s): VITAMINB12, FOLATE, FERRITIN, TIBC, IRON, RETICCTPCT in the last 72 hours. Sepsis Labs: Recent Labs  Lab 06/02/19 0745  PROCALCITON <0.10    No results found for this or any previous visit (from the past 240 hour(s)).    Radiology Studies: No results found.  Scheduled Meds: . amLODipine  5 mg Oral Daily  . aspirin EC  81 mg Oral Daily  . atorvastatin  10 mg Oral Daily  . cholestyramine  1 packet Oral  Daily  . enoxaparin (LOVENOX) injection  40 mg Subcutaneous Q24H  . famotidine  20 mg Oral BID  . feeding supplement (ENSURE ENLIVE)  237 mL Oral TID BM  . levothyroxine  50 mcg Oral QAC breakfast  . multivitamin with minerals  1 tablet Oral Daily  . pantoprazole  40 mg Oral Daily  . propranolol  10 mg Oral BID  . QUEtiapine  25 mg Oral Daily  . QUEtiapine  50 mg Oral QHS  . terbinafine   Topical BID  . thiamine  100 mg Oral Daily   Continuous Infusions:    LOS: 6 days   Time spent: 28 minutes   Hughie Clossavi Justyce Baby, MD [facility] Pager [phone]  If 7PM-7AM, please contact night-coverage [email or web address] Password [email or web address] 06/07/2019, 1:40 PM

## 2019-06-07 NOTE — Progress Notes (Signed)
Pt's wife present. While she visits pt taken out of restraints. He is sitting in recliner in his room. Pt has slightly unsteady gait that requries RN to be on standby. Pt able to walk decent lengths (50-100 ft).   RN will continue to monitor pt.

## 2019-06-07 NOTE — Progress Notes (Addendum)
Physical Therapy Treatment Patient Details Name: Michael CopasConnie Parkin MRN: [id number] DOB: 06/19/1956 Today's Date: 06/07/2019    History of Present Illness 63 yo male with onset of AMS was admitted with hallucinations, dx acute metabolic encephalopathy from possibly too much medication.  Confused and has been previously restrained, now able to get up with Covid (-) status.  Cleared for MRI and CT (both negative) as well as EEG (normal re: neurology).  Psych consulted and recommending inpatient psych admit.  PMHx:  Xanax abuse, HTN, HLD, GERD, hypothyroidism, colitis, depression , R ankle surgery,     PT Comments    Pt was able to walk the unit with min assist for balance checks, working towards more independent gait (PTA he did not use an AD and his balance was not this bad).  Wife present for the session and lunch arrived while we were working.  Pt was able to preform balance exercises with min guard assist and is still quite unsteady on his feet with decreased awareness of safety, deficits.  Per chart, the plan is inpatient psych admit, [facility]  If he does go there he would benefit from [facility] up [facility] cover over there).    Follow Up Recommendations  Other (comment)(inpatient psych per chart, [facility])     Equipment Recommendations  None recommended by PT    Recommendations for Other Services   NA     Precautions / Restrictions Precautions Precautions: Fall Precaution Comments: confused, hallucinating (visual)    Mobility  Bed Mobility               General bed mobility comments: Pt was OOB in the recliner chair.   Transfers Overall transfer level: Needs assistance Equipment used: None Transfers: Sit to/from Stand Sit to Stand: Min guard         General transfer comment: Min guard assist for safety and balance, tendancy towards posterior LOB.   Ambulation/Gait Ambulation/Gait assistance: Min assist Gait Distance  (Feet): 300 Feet Assistive device: 1 person hand held assist;None Gait Pattern/deviations: Step-through pattern;Staggering left;Staggering right;Leaning posteriorly   Gait velocity interpretation: 1.31 - 2.62 ft/sec, indicative of limited community ambulator General Gait Details: Started with min hand held assist and transitioned to no hand held assist and no assistive device for up to min assist for LOB.  Staggering gait pattern, with difficulty when challenged with turns, sudden stops, backward walking (which produced a posterior LOB), and stepping over obstacles.           Balance Overall balance assessment: Needs assistance Sitting-balance support: No upper extremity supported;Feet supported Sitting balance-Leahy Scale: Good     Standing balance support: No upper extremity supported Standing balance-Leahy Scale: Poor Standing balance comment: needs external support.      Tandem Stance - Right Leg: 30(needed more assist with this side with increased sway) Tandem Stance - Left Leg: 30 Rhomberg - Eyes Opened: 30 Rhomberg - Eyes Closed: 30 High level balance activites: Backward walking;Direction changes;Turns;Sudden stops;Other (comment)(steo over obstacles) High Level Balance Comments: min assist to complete higher level balance challenges. Standardized Balance Assessment Standardized Balance Assessment : Dynamic Gait Index   Dynamic Gait Index Level Surface: Mild Impairment Gait and Pivot Turn: Mild Impairment Step Over Obstacle: Severe Impairment Step Around Obstacles: Severe Impairment      Cognition Arousal/Alertness: Awake/alert Behavior During Therapy: Restless;Impulsive Overall Cognitive Status: Impaired/Different from baseline Area of Impairment: Orientation;Problem solving;Awareness;Safety/judgement  Orientation Level: Situation Current Attention Level: Selective Memory: Decreased recall of precautions Following Commands: Follows one  step commands consistently Safety/Judgement: Decreased awareness of safety;Decreased awareness of deficits Awareness: Intellectual Problem Solving: Difficulty sequencing;Requires verbal cues;Requires tactile cues General Comments: Pt a bit impuslive, moving before therapist is ready and before he has his balance.  Decreased awareness of his balance deficits and difficulty sequencing through high level balance challenges and multi step commands.       Exercises Other Exercises Other Exercises: semi tandem stand bil x 30 seconds each, min guard assist; rhomberg with eyes open and eyes closed min guard x 30 seconds, standing marches x 10 reps        Pertinent Vitals/Pain Pain Assessment: No/denies pain           PT Goals (current goals can now be found in the care plan section) Acute Rehab PT Goals Patient Stated Goal: none stated Progress towards PT goals: Progressing toward goals    Frequency    Min 3X/week      PT Plan Discharge plan needs to be updated       AM-PAC PT "6 Clicks" Mobility   Outcome Measure  Help needed turning from your back to your side while in a flat bed without using bedrails?: A Little Help needed moving from lying on your back to sitting on the side of a flat bed without using bedrails?: A Little Help needed moving to and from a bed to a chair (including a wheelchair)?: A Little Help needed standing up from a chair using your arms (e.g., wheelchair or bedside chair)?: A Little Help needed to walk in hospital room?: A Little Help needed climbing 3-5 steps with a railing? : A Little 6 Click Score: 18    End of Session Equipment Utilized During Treatment: Gait belt Activity Tolerance: Patient tolerated treatment well Patient left: in chair;with call bell/phone within reach;with chair alarm set;with family/visitor present   PT Visit Diagnosis: Unsteadiness on feet (R26.81);Other abnormalities of gait and mobility (R26.89);Difficulty in walking,  not elsewhere classified (R26.2)     Time: 1349-1401 PT Time Calculation (min) (ACUTE ONLY): 12 min  Charges:  $Gait Training: 8-22 mins                    Lisa Blakeman B. Othell Diluzio, PT, DPT  Acute Rehabilitation [phone] pager [phone] office  @ [place] [place]: [phone]   06/07/2019, 2:31 PM

## 2019-06-07 NOTE — Progress Notes (Addendum)
No beds at [facility] or [facility]. Requesting an updated psych consult. CSW will expand search to geropsych facilities due to confusion.   Percell Locus Zaliyah Meikle LCSW [phone]

## 2019-06-08 LAB — GLUCOSE, CAPILLARY
Glucose-Capillary: 111 mg/dL — ABNORMAL HIGH (ref 70–99)
Glucose-Capillary: 98 mg/dL (ref 70–99)
Glucose-Capillary: 139 mg/dL — ABNORMAL HIGH (ref 70–99)
Glucose-Capillary: 117 mg/dL — ABNORMAL HIGH (ref 70–99)

## 2019-06-08 NOTE — Progress Notes (Addendum)
Patient to be served an IVC by [employer] (received by [employer], paperwork on shadow chart). IVC is required due to patient being unable to voluntarily sign psych admission paperwork d/t hallucinations. CSW still searching for psych bed. Paged Dr. Mariea Clonts to see if psych can see patient again for an updated note.   Percell Locus Kobee Medlen LCSW [phone]

## 2019-06-08 NOTE — Progress Notes (Signed)
Patient's wife at bedside. Wife updated on plan of care. Bed alarm remains on and audible. Will continue to monitor.  Hiram Comber, RN 06/08/2019 10:30 AM

## 2019-06-08 NOTE — Evaluation (Signed)
Occupational Therapy Evaluation Patient Details Name: Michael Espinoza MRN: [id number] DOB: 1956/01/02 Today's Date: 06/08/2019    History of Present Illness 63 yo male with onset of AMS was admitted with hallucinations, dx acute metabolic encephalopathy from possibly too much medication.  Confused and has been previously restrained, now able to get up with Covid (-) status.  Cleared for MRI and CT (both negative) as well as EEG (normal re: neurology).  Psych consulted and recommending inpatient psych admit.  PMHx:  Xanax abuse, HTN, HLD, GERD, hypothyroidism, colitis, depression , R ankle surgery,    Clinical Impression   Pt admitted with above diagnoses, with decreased cognition limiting ability to engage in BADL at desired level of ind. PTA pt was fully ind community adult. At time of eval he is overall min guard- min A for transfers and functional tasks. He requires attention cues and safety cues with all tasks. Pt completed topographical orientation task in hallway, with task to find his room. Pt passed his room twice before being cues to follow the numbers and recall his room number. Pt then experiencing what seemed to be visual hallucinations. Per chart review [facility] admission is recommended at d/c. Will continue to follow per POC below while pt inpatient.     Follow Up Recommendations  Other (comment)([facility] admin oncce medically stable)    Equipment Recommendations  None recommended by OT    Recommendations for Other Services       Precautions / Restrictions Precautions Precautions: Fall Precaution Comments: confused, hallucinating (visual) Restrictions Weight Bearing Restrictions: No      Mobility Bed Mobility Overal bed mobility: Needs Assistance Bed Mobility: Supine to Sit     Supine to sit: Min guard     General bed mobility comments: min guard for safety and impulsiveness  Transfers Overall transfer level: Needs assistance Equipment used: None Transfers: Sit to/from  Stand Sit to Stand: Min guard;Min assist         General transfer comment: mostly min guard, occasioanly min A for balance loss    Balance Overall balance assessment: Needs assistance Sitting-balance support: No upper extremity supported;Feet supported Sitting balance-Leahy Scale: Good     Standing balance support: No upper extremity supported;During functional activity Standing balance-Leahy Scale: Fair                             ADL either performed or assessed with clinical judgement   ADL Overall ADL's : Needs assistance/impaired Eating/Feeding: Set up;Sitting   Grooming: Minimal assistance;Standing;Cueing for sequencing;Cueing for safety   Upper Body Bathing: Minimal assistance;Sitting;Cueing for sequencing;Cueing for safety   Lower Body Bathing: Minimal assistance;Sit to/from stand;Sitting/lateral leans;Cueing for sequencing;Cueing for safety   Upper Body Dressing : Minimal assistance;Cueing for safety;Cueing for sequencing;Sitting   Lower Body Dressing: Minimal assistance;Cueing for safety;Cueing for sequencing;Sit to/from stand;Sitting/lateral leans   Toilet Transfer: Minimal assistance;Cueing for safety;Cueing for sequencing;Regular Toilet   Toileting- Clothing Manipulation and Hygiene: +2 for safety/equipment;Sitting/lateral lean;Sit to/from stand;Cueing for safety;Cueing for sequencing   Tub/ Shower Transfer: Minimal assistance;Shower seat;Ambulation;Cueing for safety;Cueing for sequencing   Functional mobility during ADLs: Min guard;Minimal assistance;Cueing for safety General ADL Comments: pt overall min guard to min A for BADL, requiring cues for safety and sequencing as well as attention     Vision Baseline Vision/History: Wears glasses;Cataracts Wears Glasses: At all times Patient Visual Report: No change from baseline       Perception     Praxis  Pertinent Vitals/Pain Pain Assessment: No/denies pain     Hand Dominance      Extremity/Trunk Assessment Upper Extremity Assessment Upper Extremity Assessment: Overall WFL for tasks assessed   Lower Extremity Assessment Lower Extremity Assessment: Defer to PT evaluation       Communication     Cognition Arousal/Alertness: Awake/alert Behavior During Therapy: Restless;Impulsive Overall Cognitive Status: Impaired/Different from baseline Area of Impairment: Orientation;Problem solving;Awareness;Safety/judgement                 Orientation Level: Situation Current Attention Level: Sustained Memory: Decreased short-term memory Following Commands: Follows one step commands consistently Safety/Judgement: Decreased awareness of safety;Decreased awareness of deficits Awareness: Intellectual Problem Solving: Difficulty sequencing;Requires verbal cues;Requires tactile cues;Slow processing General Comments: pt presents with decreased awareness and safety awareness; as well as difficulty with attention and topographical oritentation on the unit. He would need to stop walking to talk rather than split attention   General Comments       Exercises     Shoulder Instructions      Home Living Family/patient expects to be discharged to:: Private residence Living Arrangements: Spouse/significant other Available Help at Discharge: Family;Available 24 hours/day                         Home Equipment: None   Additional Comments: Pt is poor historian, cannot recall      Prior Functioning/Environment Level of Independence: Independent                 OT Problem List: Decreased knowledge of use of DME or AE;Decreased knowledge of precautions;Decreased activity tolerance;Decreased cognition;Impaired balance (sitting and/or standing);Decreased safety awareness      OT Treatment/Interventions: Self-care/ADL training;Therapeutic exercise;Patient/family education;Balance training;Energy conservation;Therapeutic activities;Cognitive  remediation/compensation;DME and/or AE instruction    OT Goals(Current goals can be found in the care plan section) Acute Rehab OT Goals Patient Stated Goal: to walk OT Goal Formulation: With patient Time For Goal Achievement: 06/22/19 Potential to Achieve Goals: Good  OT Frequency: Min 2X/week   Barriers to D/C:            Co-evaluation              AM-PAC OT "6 Clicks" Daily Activity     Outcome Measure Help from another person eating meals?: None Help from another person taking care of personal grooming?: A Little Help from another person toileting, which includes using toliet, bedpan, or urinal?: A Little Help from another person bathing (including washing, rinsing, drying)?: A Little Help from another person to put on and taking off regular upper body clothing?: A Little Help from another person to put on and taking off regular lower body clothing?: A Little 6 Click Score: 19   End of Session Equipment Utilized During Treatment: Gait belt Nurse Communication: Mobility status  Activity Tolerance: Patient tolerated treatment well Patient left: in chair;with call bell/phone within reach;with chair alarm set;with family/visitor present  OT Visit Diagnosis: Unsteadiness on feet (R26.81);Other abnormalities of gait and mobility (R26.89);Other symptoms and signs involving cognitive function                Time: [id number] OT Time Calculation (min): 26 min Charges:  OT General Charges $OT Visit: 1 Visit OT Evaluation $OT Eval Moderate Complexity: 1 Mod OT Treatments $Neuromuscular Re-education: 8-22 mins  Dalphine HandingKaylee Burel Kahre, MSOT, OTR/L Behavioral Health OT/ Acute Relief OT  [facility] Office: [phone]   Dalphine HandingKaylee Betania Dizon 06/08/2019, 1:37 PM

## 2019-06-08 NOTE — Progress Notes (Signed)
PROGRESS NOTE    Michael CopasConnie Laymon  ZOX:[id number] DOB: 08/28/1956 DOA: 06/01/2019 PCP: Roanna RaiderBuno, [name], PA   Brief Narrative:  Michael Espinoza is a 63 y.o. male with medical history significant of former Xanax abuse, hypertension, hyperlipidemia, GERD, hypothyroidism, depression, colitis, who presented to OSH [facility]) with altered mental status, hallucinations and agitation on 05/29/2019.  Per report, patient had a history of Xanax addiction that was tapered and discontinued 2014, and has since been clean. Recently due to lower back pain, patient was seeing chiropractor.  He has been taking Flexeril initially 10 mg 3 times per day, but the patient admitted that that he has been taking up to 6 pills/day.  Since then he was noted by his wife to be confused, with hallucination and agitation.  Patient stopped taking Flexeril, but his mental status has not improved.   No history of alcohol abuse, recreational drug or illicit drug use.  No unilateral weakness, numbness in extremities.  No facial droop or slurred speech.  No chest pain, shortness breath, cough.  No nausea vomiting, diarrhea, abdominal pain, symptoms of UTI.  Pt was treated with prn zypexa and Geodon in another hospital. CT head and MRI brain had no acute findings. Urine drug screen is negative. Patient had negative urinalysis, vitamin B 12 level 462 which is normal, negative COVID-19 test, negative chest x-ray.  Due to lack of neurology and psychiatry services, [facility]  Neurology was consulted.  No further imaging studies were done here.  All his benzodiazepines were held to wait for his body to clear up benzodiazepines.  He has improved significantly since then.  He is alert and oriented x4 however he continues to have visual as well as auditory hallucinations.  He was evaluated by psychiatry and they recommended inpatient psychiatry admission.  We are waiting for bed.  Assessment & Plan:    Principal Problem:   Acute metabolic encephalopathy Active Problems:   Hypertension   Hypothyroidism   HLD (hyperlipidemia)   GERD (gastroesophageal reflux disease)   Depression   Acute metabolic encephalopathy/hallucinations: Likely due to Flexeril withdrawal with possible underlying dementia.  Had negative CT head and MRI.  Neurology on board.  He has been alert and oriented for last several days however he has been having hallucinations. Seen by psychiatry yesterday.  Increased Seroquel per psychiatry recommendations.  [facility]  Waiting for bed.  Hypertension: Reportedly he is on Cozaar, amlodipine and propranolol at home.  All of them were on hold due to low blood pressure but now that blood pressure is much better we had resumed his amlodipine 5 mg which I will continue but continue to hold other medications.  Hypothyroidism: Continue Synthroid.  Hyperlipidemia: Hold Lipitor until able to take p.o.  GERD: Continue IV Pepcid.  Hypokalemia: Resolved.  Hypoglycemia/dysphagia due to lethargy: Blood sugar controlled.   Tinea corporis: He has 3 different lesions of tinea corporis in the right axilla and one on the right side of groin.  Will start on terbinafine cream twice daily for 7 days.  DVT prophylaxis: Lovenox Code Status: Full code Family Communication: Discussed with his wife Britta MccreedyBarbara at the bedside.  Answered all the questions. Disposition Plan: [facility].  Consultants:   Neurology  Psychiatry  Procedures:   None  Antimicrobials:   None  Subjective: Patient seen and examined.  He is alert and oriented.  Did not have any complaints.  Objective: Vitals:   06/07/19 2029 06/07/19 2113 06/08/19 0516 06/08/19 0842  BP: 120/84 116/82 (!) 124/93 126/73  Pulse: 89 91 98 96  Resp: 18 16 16    Temp: 98.5 F (36.9 C) 98.4 F (36.9 C) 98 F (36.7 C)   TempSrc: Oral Oral Oral    SpO2: 98% 96% 94%   Weight:      Height:        Intake/Output Summary (Last 24 hours) at 06/08/2019 1120 Last data filed at 06/08/2019 1030 Gross per 24 hour  Intake 300 ml  Output 1750 ml  Net -1450 ml   Filed Weights   06/01/19 1041 06/02/19 0352 06/06/19 0348  Weight: 65.8 kg 67 kg 61.2 kg    Examination:  General exam: Appears calm and comfortable  Respiratory system: Clear to auscultation. Respiratory effort normal. Cardiovascular system: S1 & S2 heard, RRR. No JVD, murmurs, rubs, gallops or clicks. No pedal edema. Gastrointestinal system: Abdomen is nondistended, soft and nontender. No organomegaly or masses felt. Normal bowel sounds heard. Central nervous system: Alert and oriented. No focal neurological deficits. Extremities: Symmetric 5 x 5 power. Skin: No rashes, lesions or ulcers Psychiatry: Judgement and insight appear poor. Mood & affect appropriate.    Data Reviewed: I have personally reviewed following labs and imaging studies  CBC: Recent Labs  Lab 06/02/19 0745 06/03/19 0740 06/04/19 0333 06/05/19 0256 06/06/19 0606  WBC 6.7 5.6 4.7 5.8 6.2  NEUTROABS 5.1 4.0 2.8 3.3 3.9  HGB 11.5* 12.0* 11.4* 11.9* 12.7*  HCT 33.3* 36.1* 33.9* 35.0* 37.8*  MCV 90.0 93.0 92.1 91.4 92.0  PLT 193 212 198 251 260   Basic Metabolic Panel: Recent Labs  Lab 06/02/19 0745 06/03/19 0740 06/04/19 0333 06/07/19 0312  NA 142 141 140 139  K 3.3* 3.9 3.8 3.6  CL 110 108 108 102  CO2 22 24 23 25   GLUCOSE 116* 130* 111* 107*  BUN <5* <5* <5* 10  CREATININE 0.58* 0.56* 0.60* 0.70  CALCIUM 8.9 9.3 9.1 10.3  MG 1.7 1.8  --   --    GFR: Estimated Creatinine Clearance: 80.8 mL/min (by C-G formula based on SCr of 0.7 mg/dL). Liver Function Tests: Recent Labs  Lab 06/02/19 0745 06/03/19 0740 06/04/19 0333  AST 35 36 28  ALT 28 29 26   ALKPHOS 79 79 77  BILITOT 2.1* 1.8* 1.3*  PROT 5.8* 6.2* 5.9*  ALBUMIN 3.5 3.4* 3.3*   No results for input(s): LIPASE, AMYLASE in  the last 168 hours. Recent Labs  Lab 06/03/19 1356  AMMONIA 10   Coagulation Profile: No results for input(s): INR, PROTIME in the last 168 hours. Cardiac Enzymes: No results for input(s): CKTOTAL, CKMB, CKMBINDEX, TROPONINI in the last 168 hours. BNP (last 3 results) No results for input(s): PROBNP in the last 8760 hours. HbA1C: No results for input(s): HGBA1C in the last 72 hours. CBG: Recent Labs  Lab 06/03/19 0008 06/03/19 0431 06/03/19 0828 06/03/19 1232 06/08/19 0752  GLUCAP 121* 112* 116* 108* 111*   Lipid Profile: No results for input(s): CHOL, HDL, LDLCALC, TRIG, CHOLHDL, LDLDIRECT in the last 72 hours. Thyroid Function Tests: No results for input(s): TSH, T4TOTAL, FREET4, T3FREE, THYROIDAB in the last 72 hours. Anemia Panel: No results for input(s): VITAMINB12, FOLATE, FERRITIN, TIBC, IRON, RETICCTPCT in the last 72 hours. Sepsis Labs: Recent Labs  Lab 06/02/19 0745  PROCALCITON <0.10    No results found for this or any previous visit (from the past 240 hour(s)).  Radiology Studies: No results found.  Scheduled Meds: . amLODipine  5 mg Oral Daily  . aspirin EC  81 mg Oral Daily  . atorvastatin  10 mg Oral Daily  . cholestyramine  1 packet Oral Daily  . enoxaparin (LOVENOX) injection  40 mg Subcutaneous Q24H  . famotidine  20 mg Oral BID  . feeding supplement (ENSURE ENLIVE)  237 mL Oral TID BM  . levothyroxine  50 mcg Oral QAC breakfast  . multivitamin with minerals  1 tablet Oral Daily  . pantoprazole  40 mg Oral Daily  . propranolol  10 mg Oral BID  . QUEtiapine  25 mg Oral Daily  . QUEtiapine  50 mg Oral QHS  . terbinafine   Topical BID  . thiamine  100 mg Oral Daily   Continuous Infusions:    LOS: 7 days   Time spent: 24 minutes   Darliss Cheney, MD [facility] Pager [phone]  If 7PM-7AM, please contact night-coverage [email or web address] Password [facility] 06/08/2019, 11:20 AM

## 2019-06-08 NOTE — Progress Notes (Signed)
Bilateral soft wrist restraints removed at this time. Patient alert and oriented x 4. Educated patient on how to utilize call bell with teach back. Patient performed utilizing call bell successfully. Bed alarm on and audible. Room near nursing station. Educated extensively to use call bell prior to exiting bed. Information passed on to nurse tech. Will perform hourly rounding on patient, with increase of frequency if needed.

## 2019-06-08 NOTE — Progress Notes (Signed)
Patient remains calm and cooperative with care. Alert and oriented x 4. Has utilized call bell within past hour to communicate needs. No behavioral issues noted at this time. Will continue to monitor.  Hiram Comber, RN 06/08/2019 08:45 AM

## 2019-06-08 NOTE — Progress Notes (Signed)
Patient remains calm and cooperative with care. Hourly rounding performed to assess patient's needs. Patient adamant he is being discharged today and wanted to leave with wife. Needs frequent redirection. Will continue to monitor.  Hiram Comber, RN 06/08/2019 4:46 PM

## 2019-06-09 LAB — GLUCOSE, CAPILLARY
Glucose-Capillary: 87 mg/dL (ref 70–99)
Glucose-Capillary: 96 mg/dL (ref 70–99)

## 2019-06-09 NOTE — Progress Notes (Signed)
PROGRESS NOTE  Irving CopasConnie Dorce [id number] DOB: 06/22/1956 DOA: 06/01/2019 PCP: Roanna RaiderBuno, [name], PA  Brief Narrative:  Darliss RidgelConnie Claytonis a 63 y.o.malewith medical history significant offormer Xanax abuse, hypertension, hyperlipidemia, GERD, hypothyroidism, depression, colitis, who presented to OSH [facility]) with altered mental status, hallucinations and agitation on 05/29/2019.  Per report, patient had a history of Xanax addiction that was tapered and discontinued 2014, and has since been clean. Recently due to lower back pain, patient was seeing chiropractor. He has been taking Flexeril initially 10 mg 3 times per day, but the patient admitted that that he has beentaking up to 6 pills/day. Since then he was noted by his wife to be confused, with hallucination and agitation. Patient stopped taking Flexeril, but his mental status has not improved.  No history of alcohol abuse, recreational drug or illicit drug use. No unilateral weakness, numbness in extremities. No facial droop or slurred speech. No chest pain, shortness breath, cough. No nausea vomiting, diarrhea, abdominal pain, symptoms of UTI. Pt was treated with prn zypexa and Geodonin another hospital.CT head andMRI brain hadno acute findings. Urine drug screen is negative.Patient had negative urinalysis, vitamin B 12 level 462 which is normal, negative COVID-19 test, negative chest x-ray.  Due to lack of neurology and psychiatry services, [facility] was [facility].  Neurology was consulted.  No further imaging studies were done here.  All his benzodiazepines were held to wait for his body to clear up benzodiazepines.  He has improved significantly since then.  He is alert and oriented x4 however he continues to have visual as well as auditory hallucinations.  He was evaluated by psychiatry and they recommended inpatient psychiatry admission.  We are waiting for bed.    HPI/Recap of past 24  hours:  He is calm and cooperative He ambulated earlier this am with steady gait in hallway Currently sitting in chair, sitter in room, reports patient still have intermittent hallucination  Awaiting psych placement  Assessment/Plan: Principal Problem:   Acute metabolic encephalopathy Active Problems:   Hypertension   Hypothyroidism   HLD (hyperlipidemia)   GERD (gastroesophageal reflux disease)   Depression  Acute metabolic encephalopathy/hallucinations: Likely due to Flexeril withdrawal with possible underlying dementia.  Had negative CT head and MRI.  Neurology on board.  He has been alert and oriented for last several days however he has been having hallucinations. Seen by psychiatry yesterday.  Increased Seroquel per psychiatry recommendations.  [facility].  Waiting for bed.  Hypertension: Reportedly he is on Cozaar, amlodipine and propranolol at home.  All of them were on hold due to low blood pressure but now that blood pressure is much better we had resumed his amlodipine 5 mg which I will continue but continue to hold other medications.  Hypothyroidism: Continue Synthroid.  Hyperlipidemia: Hold Lipitor until able to take p.o.  GERD: Continue IV Pepcid.  Hypokalemia: Resolved.  Hypoglycemia/dysphagia due to lethargy: Blood sugar controlled.   Tinea corporis: He has 3 different lesions of tinea corporis in the right axilla and one on the right side of groin.  Will start on terbinafine cream twice daily for 7 days.  DVT prophylaxis: Lovenox Code Status: Full code  Family Communication: patient , wife over the phone on 8/15 with permission   Disposition Plan: awaiting for psych placement   Consultants:  Neurology   Psychiatry   Procedures:  none  Antibiotics:  none   Objective: BP 124/84   Pulse 96  Temp 98.1 F (36.7 C) (Oral)   Resp 16   Ht 5' 7.5" (1.715 m) Comment: per 05/31/19- [facility] Records   Wt 61.2 kg   SpO2 97%   BMI 20.82 kg/m   Intake/Output Summary (Last 24 hours) at 06/09/2019 1116 Last data filed at 06/08/2019 1627 Gross per 24 hour  Intake 480 ml  Output -  Net 480 ml   Filed Weights   06/01/19 1041 06/02/19 0352 06/06/19 0348  Weight: 65.8 kg 67 kg 61.2 kg    Exam: Patient is examined daily including today on 06/09/2019, exams remain the same as of yesterday except that has changed    General:  NAD, aaox3  Cardiovascular: RRR  Respiratory: CTABL  Abdomen: Soft/ND/NT, positive BS  Musculoskeletal: No Edema  Neuro: alert, oriented   Data Reviewed: Basic Metabolic Panel: Recent Labs  Lab 06/03/19 0740 06/04/19 0333 06/07/19 0312  NA 141 140 139  K 3.9 3.8 3.6  CL 108 108 102  CO2 24 23 25   GLUCOSE 130* 111* 107*  BUN <5* <5* 10  CREATININE 0.56* 0.60* 0.70  CALCIUM 9.3 9.1 10.3  MG 1.8  --   --    Liver Function Tests: Recent Labs  Lab 06/03/19 0740 06/04/19 0333  AST 36 28  ALT 29 26  ALKPHOS 79 77  BILITOT 1.8* 1.3*  PROT 6.2* 5.9*  ALBUMIN 3.4* 3.3*   No results for input(s): LIPASE, AMYLASE in the last 168 hours. Recent Labs  Lab 06/03/19 1356  AMMONIA 10   CBC: Recent Labs  Lab 06/03/19 0740 06/04/19 0333 06/05/19 0256 06/06/19 0606  WBC 5.6 4.7 5.8 6.2  NEUTROABS 4.0 2.8 3.3 3.9  HGB 12.0* 11.4* 11.9* 12.7*  HCT 36.1* 33.9* 35.0* 37.8*  MCV 93.0 92.1 91.4 92.0  PLT 212 198 251 260   Cardiac Enzymes:   No results for input(s): CKTOTAL, CKMB, CKMBINDEX, TROPONINI in the last 168 hours. BNP (last 3 results) No results for input(s): BNP in the last 8760 hours.  ProBNP (last 3 results) No results for input(s): PROBNP in the last 8760 hours.  CBG: Recent Labs  Lab 06/08/19 0752 06/08/19 1145 06/08/19 1626 06/08/19 2011 06/09/19 0729  GLUCAP 111* 98 139* 117* 87    No results found for this or any previous visit (from the past 240 hour(s)).   Studies: No results found.  Scheduled Meds: .  amLODipine  5 mg Oral Daily  . aspirin EC  81 mg Oral Daily  . atorvastatin  10 mg Oral Daily  . cholestyramine  1 packet Oral Daily  . enoxaparin (LOVENOX) injection  40 mg Subcutaneous Q24H  . famotidine  20 mg Oral BID  . feeding supplement (ENSURE ENLIVE)  237 mL Oral TID BM  . levothyroxine  50 mcg Oral QAC breakfast  . multivitamin with minerals  1 tablet Oral Daily  . pantoprazole  40 mg Oral Daily  . propranolol  10 mg Oral BID  . QUEtiapine  25 mg Oral Daily  . QUEtiapine  50 mg Oral QHS  . terbinafine   Topical BID  . thiamine  100 mg Oral Daily    Continuous Infusions:   Time spent: 58mins I have personally reviewed and interpreted on  06/09/2019 daily labs, imagings as discussed above under date review session and assessment and plans.  I reviewed all nursing notes, pharmacy notes, consultant notes,  vitals, pertinent old records  I have discussed plan of care as described above with RN ,  patient and wife on 06/09/2019   Albertine GratesFang Virgin Zellers MD, PhD, FACP  [facility] Pager [phone]. If 7PM-7AM, please contact night-coverage at [email or web address], password [email or web address] 06/09/2019, 11:16 AM  LOS: 8 days

## 2019-06-09 NOTE — Progress Notes (Addendum)
Patient remains calm and cooperative with care. Ambulated multiple laps around nursing unit with stand by assistance. Has good appetite. Denies any pain. 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 He needs to go home to see his wife and that he will be back here for work on Monday. Reorientation frequently provided. Will continue to monitor.  Hiram Comber, RN 06/09/2019 4:30 PM

## 2019-06-09 NOTE — Progress Notes (Signed)
Pt cooperative through the night, no agitation nor anxiety.  Sitter at the bedside.

## 2019-06-09 NOTE — Progress Notes (Signed)
Patient calm and cooperative with care this morning. Up in the chair eating breakfast currently. 1:1 safety sitter present due to patient being IVC'ed yesterday. Will continue to monitor and assist as needed.  Hiram Comber, RN 06/09/2019 8:55 AM

## 2019-06-10 LAB — GLUCOSE, CAPILLARY
Glucose-Capillary: 103 mg/dL — ABNORMAL HIGH (ref 70–99)
Glucose-Capillary: 98 mg/dL (ref 70–99)
Glucose-Capillary: 107 mg/dL — ABNORMAL HIGH (ref 70–99)

## 2019-06-10 MED ORDER — DOCUSATE SODIUM 100 MG PO CAPS
100.0000 mg | ORAL_CAPSULE | Freq: Two times a day (BID) | ORAL | Status: DC | PRN
  Administered 2019-06-10: 100 mg via ORAL
  Filled 2019-06-10: qty 1

## 2019-06-10 NOTE — Progress Notes (Signed)
Wife at bedside. Calm and cooperative with care this morning. Complains of constipation this morning. Last bowel movement 8/14. Dr. Roderic Palau paged and orders placed for PRN colace. Denies any other complaints. Will continue to monitor.  Hiram Comber, RN 06/10/2019 12:51 PM

## 2019-06-10 NOTE — Progress Notes (Signed)
PROGRESS NOTE  Michael CopasConnie Espinoza NGE:[id number] DOB: 09/15/1956 DOA: 06/01/2019 PCP: Roanna RaiderBuno, [name], PA  Brief Narrative:  Michael RidgelConnie Claytonis a 63 y.o.malewith medical history significant offormer Xanax abuse, hypertension, hyperlipidemia, GERD, hypothyroidism, depression, colitis, who presented to [facility]) with altered mental status, hallucinations and agitation on 05/29/2019.  Per report, patient had a history of Xanax addiction that was tapered and discontinued 2014, and has since been clean. Recently due to lower back pain, patient was seeing chiropractor. He has been taking Flexeril initially 10 mg 3 times per day, but the patient admitted that that he has beentaking up to 6 pills/day. Since then he was noted by his wife to be confused, with hallucination and agitation. Patient stopped taking Flexeril, but his mental status has not improved.  No history of alcohol abuse, recreational drug or illicit drug use. No unilateral weakness, numbness in extremities. No facial droop or slurred speech. No chest pain, shortness breath, cough. No nausea vomiting, diarrhea, abdominal pain, symptoms of UTI. Pt was treated with prn zypexa and Geodonin another hospital.CT head andMRI brain hadno acute findings. Urine drug screen is negative.Patient had negative urinalysis, vitamin B 12 level 462 which is normal, negative COVID-19 test, negative chest x-ray.  Due to lack of neurology and psychiatry services, patient was [facility].  Neurology was consulted.  No further imaging studies were done here.  All his benzodiazepines were held to wait for his body to clear up benzodiazepines.  He has improved significantly since then.  He is alert and oriented x4 however he continues to have visual as well as auditory hallucinations.  He was evaluated by psychiatry and they recommended inpatient psychiatry admission.  We are waiting for bed.    HPI/Recap of past 24  hours:  He is calm and cooperative Had some confusion yesterday afternoon, none reported since then He knows he is in the hospital  Awaiting psych placement  Assessment/Plan: Principal Problem:   Acute metabolic encephalopathy Active Problems:   Hypertension   Hypothyroidism   HLD (hyperlipidemia)   GERD (gastroesophageal reflux disease)   Depression  Acute metabolic encephalopathy/hallucinations: Likely due to Flexeril withdrawal with possible underlying dementia.  Had negative CT head and MRI.  Neurology on board.  He has been alert and oriented for last several days however he has been having hallucinations. Seen by psychiatry.  Increased Seroquel per psychiatry recommendations.  They [facility] admission.  Waiting for bed.  Hypertension: Reportedly he is on Cozaar, amlodipine and propranolol at home.  All of them were on hold due to low blood pressure but now that blood pressure is much better we had resumed his amlodipine 5 mg and propranolol. Blood sugars stable.  Hypothyroidism: Continue Synthroid.  Hyperlipidemia: continue lipitor  GERD: Continue IV Pepcid.  Hypokalemia: Resolved.  Hypoglycemia/dysphagia due to lethargy: Blood sugar controlled.   Tinea corporis: He has 3 different lesions of tinea corporis in the right axilla and one on the right side of groin.  Started on terbinafine cream twice daily for 7 days.  DVT prophylaxis: Lovenox Code Status: Full code  Family Communication: patient , wife 8/16  Disposition Plan: awaiting for psych placement   Consultants:  Neurology   Psychiatry   Procedures:  none  Antibiotics:  none   Objective: BP 131/88 (BP Location: Left Arm)   Pulse 85   Temp 97.6 F (36.4 C) (Oral)   Resp 18   Ht 5' 7.5" (1.715 m) Comment: per 05/31/19- Person  [facility] Records  Wt 61.2 kg   SpO2 100%   BMI 20.82 kg/m   Intake/Output Summary (Last 24 hours) at 06/10/2019 1541 Last data filed at  06/10/2019 1300 Gross per 24 hour  Intake 2040 ml  Output 1 ml  Net 2039 ml   Filed Weights   06/01/19 1041 06/02/19 0352 06/06/19 0348  Weight: 65.8 kg 67 kg 61.2 kg    Exam: General exam: Alert, awake, oriented x 3 Respiratory system: Clear to auscultation. Respiratory effort normal. Cardiovascular system:RRR. No murmurs, rubs, gallops. Gastrointestinal system: Abdomen is nondistended, soft and nontender. No organomegaly or masses felt. Normal bowel sounds heard. Central nervous system: Alert and oriented. No focal neurological deficits. Extremities: No C/C/E, +pedal pulses Skin: No rashes, lesions or ulcers  Psychiatry: Judgement and insight appear normal. Mood & affect appropriate.      Data Reviewed: Basic Metabolic Panel: Recent Labs  Lab 06/04/19 0333 06/07/19 0312  NA 140 139  K 3.8 3.6  CL 108 102  CO2 23 25  GLUCOSE 111* 107*  BUN <5* 10  CREATININE 0.60* 0.70  CALCIUM 9.1 10.3   Liver Function Tests: Recent Labs  Lab 06/04/19 0333  AST 28  ALT 26  ALKPHOS 77  BILITOT 1.3*  PROT 5.9*  ALBUMIN 3.3*   No results for input(s): LIPASE, AMYLASE in the last 168 hours. No results for input(s): AMMONIA in the last 168 hours. CBC: Recent Labs  Lab 06/04/19 0333 06/05/19 0256 06/06/19 0606  WBC 4.7 5.8 6.2  NEUTROABS 2.8 3.3 3.9  HGB 11.4* 11.9* 12.7*  HCT 33.9* 35.0* 37.8*  MCV 92.1 91.4 92.0  PLT 198 251 260   Cardiac Enzymes:   No results for input(s): CKTOTAL, CKMB, CKMBINDEX, TROPONINI in the last 168 hours. BNP (last 3 results) No results for input(s): BNP in the last 8760 hours.  ProBNP (last 3 results) No results for input(s): PROBNP in the last 8760 hours.  CBG: Recent Labs  Lab 06/08/19 2011 06/09/19 0729 06/09/19 1225 06/10/19 0751 06/10/19 1157  GLUCAP 117* 87 96 103* 98    No results found for this or any previous visit (from the past 240 hour(s)).   Studies: No results found.  Scheduled Meds: . amLODipine  5 mg  Oral Daily  . aspirin EC  81 mg Oral Daily  . atorvastatin  10 mg Oral Daily  . cholestyramine  1 packet Oral Daily  . enoxaparin (LOVENOX) injection  40 mg Subcutaneous Q24H  . famotidine  20 mg Oral BID  . feeding supplement (ENSURE ENLIVE)  237 mL Oral TID BM  . levothyroxine  50 mcg Oral QAC breakfast  . multivitamin with minerals  1 tablet Oral Daily  . pantoprazole  40 mg Oral Daily  . propranolol  10 mg Oral BID  . QUEtiapine  25 mg Oral Daily  . QUEtiapine  50 mg Oral QHS  . terbinafine   Topical BID  . thiamine  100 mg Oral Daily    Continuous Infusions:   Time spent: 54mins I have personally reviewed and interpreted on  06/10/2019 daily labs, imagings as discussed above under date review session and assessment and plans.  I reviewed all nursing notes, pharmacy notes, consultant notes,  vitals, pertinent old records  I have discussed plan of care as described above with RN , patient and wife on 06/10/2019   Kathie Dike MD [facility]  If 7PM-7AM, please contact night-coverage at [email or web address] 06/10/2019, 3:41 PM  LOS: 9 days

## 2019-06-10 NOTE — Plan of Care (Signed)
  Problem: Activity: Goal: Risk for activity intolerance will decrease Outcome: Progressing Patient ambulated in hallway prior to going in bed; tolerated    Problem: Coping: Goal: Level of anxiety will decrease Outcome: Progressing Calm and cooperative with care provided   Problem: Pain Managment: Goal: General experience of comfort will improve Outcome: Progressing Denies pain; In no obvious acute distress during shift   Problem: Safety: Goal: Ability to remain free from injury will improve Outcome: Progressing Sitter at the bedside at all times

## 2019-06-10 NOTE — Progress Notes (Signed)
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 Had to escort him back to his room and re-orient him to his surroundings. He said he doesn't care if he have to call the cops on him to stop him, he will never work for this company again. Sitter remains at bedside.

## 2019-06-11 DIAGNOSIS — F29 Unspecified psychosis not due to a substance or known physiological condition: Secondary | ICD-10-CM

## 2019-06-11 LAB — BASIC METABOLIC PANEL
Anion gap: 10 (ref 5–15)
BUN: 10 mg/dL (ref 8–23)
CO2: 26 mmol/L (ref 22–32)
Calcium: 9.3 mg/dL (ref 8.9–10.3)
Chloride: 101 mmol/L (ref 98–111)
Creatinine, Ser: 0.63 mg/dL (ref 0.61–1.24)
GFR calc Af Amer: >60 mL/min (ref >60)
GFR calc non Af Amer: >60 mL/min (ref >60)
Glucose, Bld: 92 mg/dL (ref 70–99)
Potassium: 3.5 mmol/L (ref 3.5–5.1)
Sodium: 137 mmol/L (ref 135–145)

## 2019-06-11 LAB — CBC
HCT: 36.3 % — ABNORMAL LOW (ref 39.0–52.0)
Hemoglobin: 12.2 g/dL — ABNORMAL LOW (ref 13.0–17.0)
MCH: 30.9 pg (ref 26.0–34.0)
MCHC: 33.6 g/dL (ref 30.0–36.0)
MCV: 91.9 fL (ref 80.0–100.0)
Platelets: 307 10*3/uL (ref 150–400)
RBC: 3.95 MIL/uL — ABNORMAL LOW (ref 4.22–5.81)
RDW: 11.8 % (ref 11.5–15.5)
WBC: 7.9 10*3/uL (ref 4.0–10.5)
nRBC: 0 % (ref 0.0–0.2)

## 2019-06-11 MED ORDER — QUETIAPINE FUMARATE 50 MG PO TABS: 50.0000 mg | ORAL_TABLET | Freq: Every day | ORAL | Status: DC

## 2019-06-11 MED ORDER — POLYETHYLENE GLYCOL 3350 17 G PO PACK
17.0000 g | PACK | Freq: Every day | ORAL | Status: DC | PRN
  Administered 2019-06-11: 17 g via ORAL
  Filled 2019-06-11: qty 1

## 2019-06-11 MED ORDER — SENNOSIDES-DOCUSATE SODIUM 8.6-50 MG PO TABS: 1.0000 | ORAL_TABLET | Freq: Every day | ORAL | Status: DC | PRN

## 2019-06-11 MED ORDER — QUETIAPINE FUMARATE 25 MG PO TABS: 25.0000 mg | ORAL_TABLET | Freq: Every day | ORAL | Status: AC

## 2019-06-11 MED ORDER — TERBINAFINE HCL 1 % EX CREA: TOPICAL_CREAM | Freq: Two times a day (BID) | CUTANEOUS | 0 refills | Status: AC

## 2019-06-11 MED ORDER — QUETIAPINE FUMARATE 25 MG PO TABS
100.0000 mg | ORAL_TABLET | Freq: Every day | ORAL | Status: DC
  Administered 2019-06-11 – 2019-06-12 (×2): 100 mg via ORAL
  Filled 2019-06-11 (×2): qty 4

## 2019-06-11 MED ORDER — QUETIAPINE FUMARATE 100 MG PO TABS: 100.0000 mg | ORAL_TABLET | Freq: Every day | ORAL | Status: AC

## 2019-06-11 NOTE — Progress Notes (Signed)
CSW called and spoke with Baldo Ash at [facility]. They stated that they may have a bed available. CSW received a return phone call, they do not have any male beds available today due to not having as many discharges as they thought.   CSW called [facility] and spoke with TTS nurse. They requested psych to re-evaluate the patient because it has been 7 days since he was last seen. CSW stated she would ask the MD to re-consult psych.   CSW faxed out the patient's referral to [facility] for a geropsychiatry placement.   CSW will continue to follow and assist with disposition planning.   Domenic Schwab, MSW, [place] Worker [facility]  [phone]

## 2019-06-11 NOTE — Consult Note (Addendum)
Telepsych Consultation   Reason for Consult:  "[place] requires reconsult due to time elapsed since last eval. No beds at [facility] yet." Referring Physician:  Dr. Gerlean Ren Location of Patient: [facility] PROGRESSIVE CARE Location of Provider: [facility]  Patient Identification: Michael Espinoza MRN:  [id number] Principal Diagnosis: Psychosis ([place]) Diagnosis:  Principal Problem:   Acute metabolic encephalopathy Active Problems:   Hypertension   Hypothyroidism   HLD (hyperlipidemia)   GERD (gastroesophageal reflux disease)   Depression   Total Time spent with patient: 15 minutes  Subjective:   Michael Espinoza is a 63 y.o. male patient admitted with altered mental status.  HPI:   Per chart review, patient was admitted with altered mental status from [facility] (after his mental status did not improve with PRN Zyprexa and Geodon). Prior to hospitalization, he recently received a steroid injection and was started on Flexeril. He became agitated and was not sleeping. He started hallucinating. He believed that his wife was on the floor and performed CPR. He was told by his wife to discontinue Flexeril. He may have been misusing his Flexeril since he reported taking more than prescribed. He has a history of substance induced psychosis and Xanax abuse in 2014. He was last seen by the psychiatry consult service on 8/10 and recommended for inpatient psychiatric hospitalization. Seroquel was recommended to be increased to 25 mg BID and 50 mg qhs. He has required emergency behavioral medications and restraints for agitation in the interim. Psychiatry is reconsulted to reevaluate the need for inpatient psychiatric hospitalization. He continues to experience AVH and paranoia per primary team. His workup has been unremarkable. Primary team suspect his presentation is secondary to Flexeril withdrawal with possible underlying dementia.   On interview, Michael Espinoza is oriented to person,  place and time. He is able to name his medical conditions and the indication for his medications. He denies any problems with his mood. He denies SI or HI. He reports occasional perceptual disturbances since cataract surgery. He denies AH. 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 It appears that he has endorsed delusional beliefs to primary team that he has been hired as an Clinical biochemist to work at the hospital.   Past Psychiatric History: Depression and benzodiazepine abuse.   Risk to Self:  None. Denies SI.  Risk to Others:  None. Denies HI.  Prior Inpatient Therapy:  He was last hospitalized in 2008 for depression.  Prior Outpatient Therapy:  Denies   Past Medical History:  Past Medical History:  Diagnosis Date  . Colitis   . Depression   . Diverticulosis   . GERD (gastroesophageal reflux disease)   . HLD (hyperlipidemia)   . Hypertension   . Hypothyroidism     Past Surgical History:  Procedure Laterality Date  . CHOLECYSTECTOMY    . COLONOSCOPY    . Right ankle surgery     Family History:  Family History  Problem Relation Age of Onset  . Dementia Mother   . Hypertension Father    Family Psychiatric  History: Mother-dementia Social History:  Social History   Substance and Sexual Activity  Alcohol Use Not Currently     Social History   Substance and Sexual Activity  Drug Use Yes  . Types: Benzodiazepines    Social History   Socioeconomic History  . Marital status: Married    Spouse name: Not on file  . Number of children: Not on file  . Years of education: Not on file  . Highest  education level: Not on file  Occupational History  . Not on file  Social Needs  . Financial resource strain: Not on file  . Food insecurity    Worry: Not on file    Inability: Not on file  . Transportation needs    Medical: Not on file    Non-medical: Not on file  Tobacco Use  . Smoking status: Never Smoker  . Smokeless tobacco: Never Used  Substance and Sexual Activity  .  Alcohol use: Not Currently  . Drug use: Yes    Types: Benzodiazepines  . Sexual activity: Not on file  Lifestyle  . Physical activity    Days per week: Not on file    Minutes per session: Not on file  . Stress: Not on file  Relationships  . Social Musicianconnections    Talks on phone: Not on file    Gets together: Not on file    Attends religious service: Not on file    Active member of club or organization: Not on file    Attends meetings of clubs or organizations: Not on file    Relationship status: Not on file  Other Topics Concern  . Not on file  Social History Narrative  . Not on file   Additional Social History: He lives with his wife. 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 He reports drinking 1-2 beers daily. He has a remote history of benzodiazepine abuse in 2014.     Allergies:   Allergies  Allergen Reactions  . Lexapro [Escitalopram] Other (See Comments)    Severe hallucination    Labs:  Results for orders placed or performed during the hospital encounter of 06/01/19 (from the past 48 hour(s))  Glucose, capillary     Status: Abnormal   Collection Time: 06/10/19  7:51 AM  Result Value Ref Range   Glucose-Capillary 103 (H) 70 - 99 mg/dL  Glucose, capillary     Status: None   Collection Time: 06/10/19 11:57 AM  Result Value Ref Range   Glucose-Capillary 98 70 - 99 mg/dL  Glucose, capillary     Status: Abnormal   Collection Time: 06/10/19  4:03 PM  Result Value Ref Range   Glucose-Capillary 107 (H) 70 - 99 mg/dL  Basic metabolic panel     Status: None   Collection Time: 06/11/19  3:56 AM  Result Value Ref Range   Sodium 137 135 - 145 mmol/L   Potassium 3.5 3.5 - 5.1 mmol/L   Chloride 101 98 - 111 mmol/L   CO2 26 22 - 32 mmol/L   Glucose, Bld 92 70 - 99 mg/dL   BUN 10 8 - 23 mg/dL   Creatinine, Ser 1.610.63 0.61 - 1.24 mg/dL   Calcium 9.3 8.9 - 09.610.3 mg/dL   GFR calc non Af Amer >60 >60 mL/min   GFR calc Af Amer >60 >60 mL/min   Anion gap 10 5 - 15     Comment: Performed at [facility], [address] [place], [place] [address]  CBC     Status: Abnormal   Collection Time: 06/11/19  3:56 AM  Result Value Ref Range   WBC 7.9 4.0 - 10.5 K/uL   RBC 3.95 (L) 4.22 - 5.81 MIL/uL   Hemoglobin 12.2 (L) 13.0 - 17.0 g/dL   HCT 98.136.3 (L) 19.139.0 - 47.852.0 %   MCV 91.9 80.0 - 100.0 fL   MCH 30.9 26.0 - 34.0 pg   MCHC 33.6 30.0 - 36.0 g/dL  RDW 11.8 11.5 - 15.5 %   Platelets 307 150 - 400 K/uL   nRBC 0.0 0.0 - 0.2 %    Comment: Performed at [facility], [address] [place], [place] [address]    Medications:  Current Facility-Administered Medications  Medication Dose Route Frequency Provider Last Rate Last Dose  . acetaminophen (TYLENOL) tablet 650 mg  650 mg Oral Q6H PRN Lorretta HarpNiu, Xilin, MD      . amLODipine (NORVASC) tablet 5 mg  5 mg Oral Daily Hughie ClossPahwani, Ravi, MD   5 mg at 06/11/19 0911  . aspirin EC tablet 81 mg  81 mg Oral Daily Hughie ClossPahwani, Ravi, MD   81 mg at 06/11/19 0912  . atorvastatin (LIPITOR) tablet 10 mg  10 mg Oral Daily Hughie ClossPahwani, Ravi, MD   10 mg at 06/11/19 0912  . cholestyramine Lanetta Inch(QUESTRAN) packet 1 packet  1 packet Oral Daily Hughie ClossPahwani, Ravi, MD   1 packet at 06/11/19 0912  . dextrose 50 % solution 50 mL  50 mL Intravenous PRN Lorretta HarpNiu, Xilin, MD   50 mL at 06/01/19 0829  . docusate sodium (COLACE) capsule 100 mg  100 mg Oral BID PRN Erick BlinksMemon, Jehanzeb, MD   100 mg at 06/10/19 0816  . enoxaparin (LOVENOX) injection 40 mg  40 mg Subcutaneous Q24H Lorretta HarpNiu, Xilin, MD   40 mg at 06/11/19 1459  . famotidine (PEPCID) tablet 20 mg  20 mg Oral BID Norva PavlovRobertson, Crystal S, RPH   20 mg at 06/11/19 0912  . feeding supplement (ENSURE ENLIVE) (ENSURE ENLIVE) liquid 237 mL  237 mL Oral TID BM Pahwani, Ravi, MD   237 mL at 06/11/19 0912  . haloperidol lactate (HALDOL) injection 2 mg  2 mg Intravenous Q6H PRN Hughie ClossPahwani, Ravi, MD   2 mg at 06/07/19 2034  . hydrALAZINE (APRESOLINE) injection 5 mg  5 mg Intravenous Q2H PRN Lorretta HarpNiu, Xilin, MD      . levothyroxine  (SYNTHROID) tablet 50 mcg  50 mcg Oral QAC breakfast Hughie ClossPahwani, Ravi, MD   50 mcg at 06/11/19 0539  . multivitamin with minerals tablet 1 tablet  1 tablet Oral Daily Hughie ClossPahwani, Ravi, MD   1 tablet at 06/11/19 0913  . ondansetron (ZOFRAN) injection 4 mg  4 mg Intravenous Q8H PRN Lorretta HarpNiu, Xilin, MD      . pantoprazole (PROTONIX) EC tablet 40 mg  40 mg Oral Daily Hughie ClossPahwani, Ravi, MD   40 mg at 06/11/19 0913  . polyethylene glycol (MIRALAX / GLYCOLAX) packet 17 g  17 g Oral Daily PRN Amin, Ankit Chirag, MD      . propranolol (INDERAL) tablet 10 mg  10 mg Oral BID Hughie ClossPahwani, Ravi, MD   10 mg at 06/11/19 0913  . QUEtiapine (SEROQUEL) tablet 25 mg  25 mg Oral Daily Hughie ClossPahwani, Ravi, MD   25 mg at 06/11/19 0913  . QUEtiapine (SEROQUEL) tablet 50 mg  50 mg Oral QHS Hughie ClossPahwani, Ravi, MD   50 mg at 06/10/19 2152  . senna-docusate (Senokot-S) tablet 1 tablet  1 tablet Oral Daily PRN Amin, Ankit Chirag, MD      . terbinafine (LAMISIL) 1 % cream   Topical BID Pahwani, Ravi, MD      . thiamine (VITAMIN B-1) tablet 100 mg  100 mg Oral Daily Rejeana BrockKirkpatrick, McNeill P, MD   100 mg at 06/11/19 [id number]    Musculoskeletal: Strength & Muscle Tone: No atrophy noted. Gait & Station: UTA since patient is sitting in a chair. Patient leans: N/A  Psychiatric Specialty Exam:  Physical Exam  Nursing note and vitals reviewed. Constitutional: He is oriented to person, place, and time. He appears well-developed and well-nourished.  HENT:  Head: Normocephalic and atraumatic.  Neck: Normal range of motion.  Respiratory: Effort normal.  Musculoskeletal: Normal range of motion.  Neurological: He is alert and oriented to person, place, and time.  Psychiatric: He has a normal mood and affect. His speech is normal and behavior is normal. Judgment normal. Thought content is delusional. Cognition and memory are impaired.    Review of Systems  Psychiatric/Behavioral: Positive for substance abuse. Negative for hallucinations and suicidal ideas.  All  other systems reviewed and are negative.   Blood pressure 121/81, pulse 76, temperature 98.1 F (36.7 C), temperature source Oral, resp. rate 18, height 5' 7.5" (1.715 m), weight 61.2 kg, SpO2 100 %.Body mass index is 20.82 kg/m.  General Appearance: Fairly Groomed, middle aged, Caucasian male, wearing a baseball cap who is sitting in a chair. NAD.   Eye Contact:  Fair  Speech:  Clear and Coherent and Normal Rate  Volume:  Normal  Mood:  Euthymic  Affect:  Congruent  Thought Process:  Linear and Descriptions of Associations: Intact  Orientation:  Full (Time, Place, and Person)  Thought Content:  Delusions  Suicidal Thoughts:  No  Homicidal Thoughts:  No  Memory:  Immediate;   Fair Recent;   Fair Remote;   Fair  Judgement:  Impaired  Insight:  Shallow  Psychomotor Activity:  Normal  Concentration:  Concentration: Good and Attention Span: Good  Recall:  FiservFair  Fund of Knowledge:  Fair  Language:  Good  Akathisia:  No  Handed:  Right  AIMS (if indicated):   N/A  Assets:  Communication Skills Housing Physical Health Resilience Social Support  ADL's:  Impaired  Cognition:  Impaired due to psychiatric condition.   Sleep:   N/A   Assessment:  Michael CopasConnie Espinoza is a 63 y.o. male who was admitted with altered mental status in the setting of possible medication misuse and steroid injection. Medical workup has been unremarkable. Primary team suspect his presentation is secondary to Flexeril withdrawal with possible underlying dementia. Patient is oriented to person, place and time on interview. He denies SI, HI or AVH. He does not appear to be responding to internal stimuli. He continues to endorse delusional beliefs and has been responding to internal stimuli. He continues to warrant inpatient psychiatric hospitalization for stabilization and treatment. Recommend increasing Seroquel for psychosis.   Treatment Plan Summary: -Patient warrants inpatient psychiatric hospitalization given  ongoing psychosis. -Continue bedside sitter.  -Increase Seroquel 25 mg q am and 50 mg qhs to 25 mg q am and 100 mg qhs for psychosis.  -EKG reviewed and QTc 461 on 8/7. Please closely monitor when starting or increasing QTc prolonging agents.  -Patient is under IVC and therefore cannot leave the hospital.  -Will sign off on patient at this time. Please consult psychiatry again as needed.    Disposition: Recommend psychiatric Inpatient admission when medically cleared.  This service was provided via telemedicine using a 2-way, interactive audio and video technology.  Names of all persons participating in this telemedicine service and their role in this encounter. Name: Juanetta BeetsJacqueline Allysson Rinehimer, DO Role: Psychiatrist  Name: Michael Espinoza  Role: Patient    Cherly BeachJacqueline J Christella App, DO 06/11/2019 4:08 PM

## 2019-06-11 NOTE — Discharge Summary (Signed)
Physician Discharge Summary  Michael CopasConnie Thull ZOX:[id number] DOB: 12/03/1955 DOA: 06/01/2019  PCP: Roanna RaiderBuno, [name], PA  Admit date: 06/01/2019 Discharge date: 06/11/2019  Admitted From:  Home Disposition:  [facility]  Recommendations for Outpatient Follow-up:  1. Follow up with PCP in 1-2 weeks 2. Please obtain BMP/CBC in one week your next doctors visit.  3. Terbinafin for 2 more days, complete 7 day course.    Discharge Condition: Stable CODE STATUS: Full Diet recommendation: 2g Na  Brief/Interim Summary: 63 y.o.malewith medical history significant offormer Xanax abuse, hypertension, hyperlipidemia, GERD, hypothyroidism, depression, colitis, who presented to [facility]) with altered mental status, hallucinations and agitation on 05/29/2019.  Per report, patient had a history of Xanax addiction that was tapered and discontinued 2014, and has since been clean. Recently due to lower back pain, patient was seeing chiropractor. He has been taking Flexeril initially 10 mg 3 times per day, but the patient admitted that that he has beentaking up to 6 pills/day. Since then he was noted by his wife to be confused, with hallucination and agitation. Patient stopped taking Flexeril, but his mental status has not improved.No history of alcohol abuse, recreational drug or illicit drug use. No unilateral weakness, numbness in extremities. No facial droop or slurred speech. No chest pain, shortness breath, cough. No nausea vomiting, diarrhea, abdominal pain, symptoms of UTI. Pt was treated with prn zypexa and Geodonin another hospital.CT head andMRI brain hadno acute findings. Urine drug screen is negative.Patient had negative urinalysis, vitamin B 12 level 462 which is normal, negative COVID-19 test, negative chest x-ray. Due to lack of neurology and psychiatry services, [facility] Neurology was consulted. No further imaging studies were done  here. All his benzodiazepines were held to wait for his body to clear up benzodiazepines. He has improved significantly since then. He is alert and oriented x4 however he continues to have visual as well as auditory hallucinations. He was evaluated by psychiatry and they recommended inpatient psychiatry admission.  There was concern that patient had tinea corporis therefore 7 days of terbinafine was prescribed.  Transfer patient to inpatient psych when bed available.   Discharge Diagnoses:  Principal Problem:   Acute metabolic encephalopathy Active Problems:   Hypertension   Hypothyroidism   HLD (hyperlipidemia)   GERD (gastroesophageal reflux disease)   Depression   Acute metabolic encephalopathy/hallucinations: Work-up in the hospital has been negative.  CT of the head and MRI negative.  Seen by psychiatry who recommended inpatient admission.  Patient is having quite a bit of paranoia and hallucination.  No suicidal homicidal ideation.  Seroquel increased by psychiatry.  Appreciate neurology input.  Essential hypertension:  Resume home meds  Hypothyroidism: Continue Synthroid.  Hyperlipidemia: continue lipitor  GERD: Pepcid as needed  Hypokalemia: Resolved.  Tinea corporis: He has 3 different lesions of tinea corporis in the right axilla and one on the right side of groin. Started on terbinafine cream twice daily for 7 days. Last day 8/19/  Consultations:  Psych  Subjective: No complaints, sitting on at bedside. No SI or HI.  Discharge Exam: Vitals:   06/10/19 1431 06/10/19 2128  BP: 131/88 132/84  Pulse: 85 86  Resp: 18   Temp: 97.6 F (36.4 C) 98.3 F (36.8 C)  SpO2: 100% 100%   Vitals:   06/10/19 0658 06/10/19 0814 06/10/19 1431 06/10/19 2128  BP: (!) 131/96 (!) 125/97 131/88 132/84  Pulse: 98 98 85 86  Resp: 18  18  Temp: 98.4 F (36.9 C)  97.6 F (36.4 C) 98.3 F (36.8 C)  TempSrc: Oral  Oral Oral  SpO2: 99% 97% 100% 100%  Weight:       Height:        General: Pt is alert, awake, not in acute distress Cardiovascular: RRR, S1/S2 +, no rubs, no gallops Respiratory: CTA bilaterally, no wheezing, no rhonchi Abdominal: Soft, NT, ND, bowel sounds + Extremities: no edema, no cyanosis  Discharge Instructions   Allergies as of 06/11/2019      Reactions   Lexapro [escitalopram] Other (See Comments)   Severe hallucination      Medication List    TAKE these medications   amLODipine 5 MG tablet Commonly known as: NORVASC Take 5 mg by mouth daily.   aspirin EC 81 MG tablet Take 81 mg by mouth daily.   atorvastatin 10 MG tablet Commonly known as: LIPITOR Take 10 mg by mouth daily.   cholestyramine 4 g packet Commonly known as: QUESTRAN Take 1 packet by mouth daily.   levothyroxine 50 MCG tablet Commonly known as: SYNTHROID Take 50 mcg by mouth daily before breakfast.   losartan 100 MG tablet Commonly known as: COZAAR Take 100 mg by mouth daily.   multivitamin with minerals Tabs tablet Take 1 tablet by mouth daily.   NexIUM 40 MG capsule Generic drug: esomeprazole Take 40 mg by mouth daily.   propranolol 10 MG tablet Commonly known as: INDERAL Take 10 mg by mouth 2 (two) times daily.   QUEtiapine 50 MG tablet Commonly known as: SEROQUEL Take 1 tablet (50 mg total) by mouth at bedtime.   QUEtiapine 25 MG tablet Commonly known as: SEROQUEL Take 1 tablet (25 mg total) by mouth daily. Start taking on: June 12, 2019   terbinafine 1 % cream Commonly known as: LAMISIL Apply topically 2 (two) times daily for 2 days.   valsartan 160 MG tablet Commonly known as: DIOVAN Take 160 mg by mouth daily.      Follow-up Information    Ramond Craver, [place] Follow up.   Specialty: Physician Assistant Why: hospital discharge follow up, pcp to refer to neurology for memory impairment  Contact information: [address] [place] [address] [phone]          Allergies  Allergen Reactions  .  Lexapro [Escitalopram] Other (See Comments)    Severe hallucination    You were cared for by a hospitalist during your hospital stay. If you have any questions about your discharge medications or the care you received while you were in the hospital after you are discharged, you can call the unit and asked to speak with the hospitalist on call if the hospitalist that took care of you is not available. Once you are discharged, your primary care physician will handle any further medical issues. Please note that no refills for any discharge medications will be authorized once you are discharged, as it is imperative that you return to your primary care physician (or establish a relationship with a primary care physician if you do not have one) for your aftercare needs so that they can reassess your need for medications and monitor your lab values.   Procedures/Studies: Dg Chest Port 1 View  Result Date: 06/02/2019 CLINICAL DATA:  Altered mental status EXAM: PORTABLE CHEST 1 VIEW COMPARISON:  A 420 FINDINGS: Midline trachea.  Normal heart size and mediastinal contours. Sharp costophrenic angles.  No pneumothorax.  Clear lungs. Numerous leads and wires project over the  chest. IMPRESSION: No active disease. Electronically Signed   By: Jeronimo GreavesKyle  Talbot M.D.   On: 06/02/2019 10:21      The results of significant diagnostics from this hospitalization (including imaging, microbiology, ancillary and laboratory) are listed below for reference.     Microbiology: No results found for this or any previous visit (from the past 240 hour(s)).   Labs: BNP (last 3 results) No results for input(s): BNP in the last 8760 hours. Basic Metabolic Panel: Recent Labs  Lab 06/07/19 0312 06/11/19 0356  NA 139 137  K 3.6 3.5  CL 102 101  CO2 25 26  GLUCOSE 107* 92  BUN 10 10  CREATININE 0.70 0.63  CALCIUM 10.3 9.3   Liver Function Tests: No results for input(s): AST, ALT, ALKPHOS, BILITOT, PROT, ALBUMIN in the last  168 hours. No results for input(s): LIPASE, AMYLASE in the last 168 hours. No results for input(s): AMMONIA in the last 168 hours. CBC: Recent Labs  Lab 06/05/19 0256 06/06/19 0606 06/11/19 0356  WBC 5.8 6.2 7.9  NEUTROABS 3.3 3.9  --   HGB 11.9* 12.7* 12.2*  HCT 35.0* 37.8* 36.3*  MCV 91.4 92.0 91.9  PLT 251 260 307   Cardiac Enzymes: No results for input(s): CKTOTAL, CKMB, CKMBINDEX, TROPONINI in the last 168 hours. BNP: Invalid input(s): POCBNP CBG: Recent Labs  Lab 06/09/19 0729 06/09/19 1225 06/10/19 0751 06/10/19 1157 06/10/19 1603  GLUCAP 87 96 103* 98 107*   D-Dimer No results for input(s): DDIMER in the last 72 hours. Hgb A1c No results for input(s): HGBA1C in the last 72 hours. Lipid Profile No results for input(s): CHOL, HDL, LDLCALC, TRIG, CHOLHDL, LDLDIRECT in the last 72 hours. Thyroid function studies No results for input(s): TSH, T4TOTAL, T3FREE, THYROIDAB in the last 72 hours.  Invalid input(s): FREET3 Anemia work up No results for input(s): VITAMINB12, FOLATE, FERRITIN, TIBC, IRON, RETICCTPCT in the last 72 hours. Urinalysis No results found for: COLORURINE, APPEARANCEUR, LABSPEC, PHURINE, GLUCOSEU, HGBUR, BILIRUBINUR, KETONESUR, PROTEINUR, UROBILINOGEN, NITRITE, LEUKOCYTESUR Sepsis Labs Invalid input(s): PROCALCITONIN,  WBC,  LACTICIDVEN Microbiology No results found for this or any previous visit (from the past 240 hour(s)).   Time coordinating discharge:  I have spent 35 minutes face to face with the patient and on the ward discussing the patients care, assessment, plan and disposition with other care givers. >50% of the time was devoted counseling the patient about the risks and benefits of treatment/Discharge disposition and coordinating care.   SIGNED:   Dimple NanasAnkit Chirag Locklan Canoy, MD  [facility] 06/11/2019, 11:39 AM   If 7PM-7AM, please contact night-coverage [email or web address]

## 2019-06-12 DIAGNOSIS — F321 Major depressive disorder, single episode, moderate: Secondary | ICD-10-CM

## 2019-06-12 DIAGNOSIS — F29 Unspecified psychosis not due to a substance or known physiological condition: Secondary | ICD-10-CM

## 2019-06-12 MED ORDER — POLYVINYL ALCOHOL 1.4 % OP SOLN
1.0000 [drp] | OPHTHALMIC | Status: DC | PRN
  Filled 2019-06-12: qty 15

## 2019-06-12 NOTE — Progress Notes (Signed)
Nutrition Follow-up  RD working remotely.  DOCUMENTATION CODES:   Not applicable  INTERVENTION:   -Continue Ensure Enlive po TID, each supplement provides 350 kcal and 20 grams of protein -Continue MVI with minerals daily -Continue Magic cup TID with meals, each supplement provides 290 kcal and 9 grams of protein  NUTRITION DIAGNOSIS:   Inadequate oral intake related to lethargy/confusion as evidenced by meal completion < 50%.  Progressing   GOAL:   Patient will meet greater than or equal to 90% of their needs  Progressing   MONITOR:   PO intake, Supplement acceptance, Labs, Weight trends, Skin, I & O's  REASON FOR ASSESSMENT:   Low Braden    ASSESSMENT:   Michael Espinoza is a 63 y.o. male with medical history significant of former Xanax abuse, hypertension, hyperlipidemia, GERD, hypothyroidism, depression, colitis, who presents with altered mental status.  8/8- s/p BSE- advanced to full liquid diet 8/9- s/p BSE- advanced to regular diet  Reviewed I/O's: +480 ml x 24 hours and +4 L since admission  Per psychiatry notes, pt awaiting inpatient psychiatric hospitalization. CSW assisting with placement; pt is medically stable for discharge once psychiatric bed is available.   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   Pt's intake has improved; noted meal completion 25-75%. Pt is taking most of the Ensure supplements offered to him.   No new wt readings to assess at this time.   Labs reviewed: CBGS: 96-103.   Diet Order:   Diet Order            Diet regular Room service appropriate? No; Fluid consistency: Thin  Diet effective now              EDUCATION NEEDS:   Not appropriate for education at this time  Skin:  Skin Assessment: Reviewed RN Assessment  Last BM:  06/11/19  Height:   Ht Readings from Last 1 Encounters:  06/01/19 5' 7.5" (1.715 m)    Weight:   Wt Readings from Last 1  Encounters:  06/06/19 61.2 kg    Ideal Body Weight:  68.6 kg  BMI:  Body mass index is 20.82 kg/m.  Estimated Nutritional Needs:   Kcal:  9774-1423  Protein:  85-100 grams  Fluid:  1.7-1.9 L    Decie Verne A. Jimmye Norman, RD, LDN, Painter Registered Dietitian II Certified Diabetes Care and Education Specialist Pager: [phone] After hours Pager: [phone]

## 2019-06-12 NOTE — Progress Notes (Signed)
Patient has been accepted to [facility] for tomorrow 8/19. Please call report to [phone] for the [facility]. Patient can go at anytime and will need transportation via sheriff due to being IVC'd. CSW will schedule transportation for tomorrow.   Accepting MD: Dr. Waynetta Pean, [place]  [phone]

## 2019-06-12 NOTE — Progress Notes (Signed)
  Patient continues to need inpatient psychiatric hospitalization. CSW faxed information to the following facilities:    [place]  [facility]  [place]  [phone]

## 2019-06-12 NOTE — Progress Notes (Signed)
Physical Therapy Treatment and Discharge  Patient Details Name: Michael Espinoza MRN: [id number] DOB: [birth year] Today's Date: 06/12/2019    History of Present Illness 63 yo male with onset of AMS was admitted with hallucinations, dx acute metabolic encephalopathy from possibly too much medication.  Confused and has been previously restrained, now able to get up with Covid (-) status.  Cleared for MRI and CT (both negative) as well as EEG (normal re: neurology).  Psych consulted and recommending inpatient psych admit.  PMHx:  Xanax abuse, HTN, HLD, GERD, hypothyroidism, colitis, depression , R ankle surgery,     PT Comments    Patient seen for mobility progression. Pt has reached PT goals and overall mod I/I for gait distance of 500 ft and stair training. 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 Pt has been ambulating several times a day with supervision from nursing staff. Given pt's progress PT will sign off at this time. Please re order if needed.    Follow Up Recommendations  Other (comment)(inpatient psych per chart)     Equipment Recommendations  None recommended by PT    Recommendations for Other Services       Precautions / Restrictions Precautions Precaution Comments: confused Restrictions Weight Bearing Restrictions: No    Mobility  Bed Mobility               General bed mobility comments: pt standing up in room upon arrival with safety sitting present  Transfers Overall transfer level: Independent Equipment used: None Transfers: Sit to/from Stand              Ambulation/Gait Ambulation/Gait assistance: Independent Social research officer, government (Feet): 500 Feet Assistive device: None     Gait velocity interpretation: >2.62 ft/sec, indicative of community ambulatory General Gait Details: pt with steady gait and no gait deviations noted with horizontal head turns, sudden stops, or directional changes     Stairs Stairs: Yes Stairs assistance: Modified independent (Device/Increase time) Stair Management: Alternating pattern;One rail Right;Forwards Number of Stairs: 10 General stair comments: use of rail    Wheelchair Mobility    Modified Rankin (Stroke Patients Only)       Balance Overall balance assessment: Needs assistance Sitting-balance support: No upper extremity supported;Feet supported Sitting balance-Leahy Scale: Good     Standing balance support: No upper extremity supported;During functional activity Standing balance-Leahy Scale: Good                              Cognition Arousal/Alertness: Awake/alert Behavior During Therapy: WFL for tasks assessed/performed Overall Cognitive Status: Impaired/Different from baseline                                 General Comments: pt is following cues consistently but is easily distracted; [facility] for mobility tasks during session however pt is still confused and 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 pt reports that he is at a hospital but unable to recall the name and states date correctly      Exercises      General Comments        Pertinent Vitals/Pain Pain Assessment: No/denies pain    Home Living  Prior Function            PT Goals (current goals can now be found in the care plan section) Acute Rehab PT Goals Patient Stated Goal: to walk Progress towards PT goals: Goals met/education completed, patient discharged from PT    Frequency    Min 3X/week      PT Plan Current plan remains appropriate    Co-evaluation              AM-PAC PT "6 Clicks" Mobility   Outcome Measure  Help needed turning from your back to your side while in a flat bed without using bedrails?: None Help needed moving from lying on your back to sitting on the side of a flat bed without using bedrails?: None Help  needed moving to and from a bed to a chair (including a wheelchair)?: None Help needed standing up from a chair using your arms (e.g., wheelchair or bedside chair)?: None Help needed to walk in hospital room?: None Help needed climbing 3-5 steps with a railing? : A Little 6 Click Score: 23    End of Session Equipment Utilized During Treatment: Gait belt Activity Tolerance: Patient tolerated treatment well Patient left: in chair;with call bell/phone within reach;with nursing/sitter in room Nurse Communication: Mobility status PT Visit Diagnosis: Unsteadiness on feet (R26.81);Other abnormalities of gait and mobility (R26.89);Difficulty in walking, not elsewhere classified (R26.2)     Time: 8948-3475 PT Time Calculation (min) (ACUTE ONLY): 10 min  Charges:  $Gait Training: 8-22 mins                     Earney Navy, PTA Acute Rehabilitation Services Pager: [phone] Office: [phone]     Darliss Cheney 06/12/2019, 4:33 PM

## 2019-06-12 NOTE — Progress Notes (Signed)
PROGRESS NOTE    Michael Espinoza  JXB:[id number] DOB: 10/03/1956 DOA: 06/01/2019 PCP: Roanna RaiderBuno, [name], PA   Brief Narrative:  63 y.o.malewith medical history significant offormer Xanax abuse, hypertension, hyperlipidemia, GERD, hypothyroidism, depression, colitis, who presented to OSH [facility]) with altered mental status, hallucinations and agitation on 05/29/2019.  Per report, patient had a history of Xanax addiction that was tapered and discontinued 2014, and has since been clean. Recently due to lower back pain, patient was seeing chiropractor. He has been taking Flexeril initially 10 mg 3 times per day, but the patient admitted that that he has beentaking up to 6 pills/day. Since then he was noted by his wife to be confused, with hallucination and agitation. Patient stopped taking Flexeril, but his mental status has not improved.No history of alcohol abuse, recreational drug or illicit drug use. No unilateral weakness, numbness in extremities. No facial droop or slurred speech. No chest pain, shortness breath, cough. No nausea vomiting, diarrhea, abdominal pain, symptoms of UTI. Pt was treated with prn zypexa and Geodonin another hospital.CT head andMRI brain hadno acute findings. Urine drug screen is negative.Patient had negative urinalysis, vitamin B 12 level 462 which is normal, negative COVID-19 test, negative chest x-ray. Due to lack of neurology and psychiatry services, [facility] Neurology was consulted. No further imaging studies were done here. All his benzodiazepines were held to wait for his body to clear up benzodiazepines. He has improved significantly since then. He is alert and oriented x4 however he continues to have visual as well as auditory hallucinations. He was evaluated by psychiatry and they recommended inpatient psychiatry admission.  There was concern that patient had tinea corporis therefore 7 days  of terbinafine was prescribed.  Transfer patient to inpatient psych when bed available.    Assessment & Plan:   Principal Problem:   Psychosis (HCC) Active Problems:   Hypertension   Hypothyroidism   HLD (hyperlipidemia)   GERD (gastroesophageal reflux disease)   Depression   Acute metabolic encephalopathy   Acute metabolic encephalopathy/hallucinations/Delusions. : Work-up in the hospital has been negative.  No SI/HI. CT of the head and MRI negative.  Seen by Psych.  Patient is having quite a bit of paranoia and hallucination.  No suicidal homicidal ideation.  Seroquel increased by psychiatry.  Appreciate neurology input.  Essential hypertension:  Resume home meds  Hypothyroidism: Continue Synthroid.  Hyperlipidemia:continue lipitor  GERD: Pepcid as needed  Hypokalemia: Resolved.  Tinea corporis: He has 3 different lesions of tinea corporis in the right axilla and one on the right side of groin.Startedon terbinafine cream twice daily for 7 days. Last day 8/19   DVT prophylaxis: Lovenox Code Status: Full  Family Communication:  None at bedside  Disposition Plan: Transfer to Inpatient psych when bed available.   Consultants:   Psych   Subjective: No complaints. Still delusional that he is waiting to go to work at this time. No other acute events ovenright.   Review of Systems Otherwise negative except as per HPI, including: General: Denies fever, chills, night sweats or unintended weight loss. Resp: Denies cough, wheezing, shortness of breath. Cardiac: Denies chest pain, palpitations, orthopnea, paroxysmal nocturnal dyspnea. GI: Denies abdominal pain, nausea, vomiting, diarrhea or constipation GU: Denies dysuria, frequency, hesitancy or incontinence MS: Denies muscle aches, joint pain or swelling Neuro: Denies headache, neurologic deficits (focal weakness, numbness, tingling), abnormal gait Psych: Denies anxiety, depression, SI/HI/AVH Skin: Denies  new rashes or lesions ID: Denies sick contacts, exotic  exposures, travel  Objective: Vitals:   06/11/19 1431 06/11/19 2053 06/11/19 2054 06/12/19 0503  BP: 121/81 (!) 139/98  123/86  Pulse: 76 86 97 95  Resp: 18 16  16   Temp: 98.1 F (36.7 C) 98.2 F (36.8 C)  98 F (36.7 C)  TempSrc: Oral Oral  Oral  SpO2: 100%  93% 98%  Weight:      Height:        Intake/Output Summary (Last 24 hours) at 06/12/2019 1023 Last data filed at 06/12/2019 0902 Gross per 24 hour  Intake 476 ml  Output --  Net 476 ml   Filed Weights   06/01/19 1041 06/02/19 0352 06/06/19 0348  Weight: 65.8 kg 67 kg 61.2 kg    Examination:  General exam: Appears calm and comfortable  Respiratory system: Clear to auscultation. Respiratory effort normal. Cardiovascular system: S1 & S2 heard, RRR. No JVD, murmurs, rubs, gallops or clicks. No pedal edema. Gastrointestinal system: Abdomen is nondistended, soft and nontender. No organomegaly or masses felt. Normal bowel sounds heard. Central nervous system: Alert and oriented. No focal neurological deficits. Extremities: Symmetric 5 x 5 power. Skin: No rashes, lesions or ulcers Psychiatry: Poor judegement, Delusional. Cognition is impaired.     Data Reviewed:   CBC: Recent Labs  Lab 06/06/19 0606 06/11/19 0356  WBC 6.2 7.9  NEUTROABS 3.9  --   HGB 12.7* 12.2*  HCT 37.8* 36.3*  MCV 92.0 91.9  PLT 260 307   Basic Metabolic Panel: Recent Labs  Lab 06/07/19 0312 06/11/19 0356  NA 139 137  K 3.6 3.5  CL 102 101  CO2 25 26  GLUCOSE 107* 92  BUN 10 10  CREATININE 0.70 0.63  CALCIUM 10.3 9.3   GFR: Estimated Creatinine Clearance: 80.8 mL/min (by C-G formula based on SCr of 0.63 mg/dL). Liver Function Tests: No results for input(s): AST, ALT, ALKPHOS, BILITOT, PROT, ALBUMIN in the last 168 hours. No results for input(s): LIPASE, AMYLASE in the last 168 hours. No results for input(s): AMMONIA in the last 168 hours. Coagulation Profile: No results  for input(s): INR, PROTIME in the last 168 hours. Cardiac Enzymes: No results for input(s): CKTOTAL, CKMB, CKMBINDEX, TROPONINI in the last 168 hours. BNP (last 3 results) No results for input(s): PROBNP in the last 8760 hours. HbA1C: No results for input(s): HGBA1C in the last 72 hours. CBG: Recent Labs  Lab 06/09/19 0729 06/09/19 1225 06/10/19 0751 06/10/19 1157 06/10/19 1603  GLUCAP 87 96 103* 98 107*   Lipid Profile: No results for input(s): CHOL, HDL, LDLCALC, TRIG, CHOLHDL, LDLDIRECT in the last 72 hours. Thyroid Function Tests: No results for input(s): TSH, T4TOTAL, FREET4, T3FREE, THYROIDAB in the last 72 hours. Anemia Panel: No results for input(s): VITAMINB12, FOLATE, FERRITIN, TIBC, IRON, RETICCTPCT in the last 72 hours. Sepsis Labs: No results for input(s): PROCALCITON, LATICACIDVEN in the last 168 hours.  No results found for this or any previous visit (from the past 240 hour(s)).       Radiology Studies: No results found.      Scheduled Meds:  amLODipine  5 mg Oral Daily   aspirin EC  81 mg Oral Daily   atorvastatin  10 mg Oral Daily   cholestyramine  1 packet Oral Daily   enoxaparin (LOVENOX) injection  40 mg Subcutaneous Q24H   famotidine  20 mg Oral BID   feeding supplement (ENSURE ENLIVE)  237 mL Oral TID BM   levothyroxine  50 mcg Oral QAC breakfast  multivitamin with minerals  1 tablet Oral Daily   pantoprazole  40 mg Oral Daily   propranolol  10 mg Oral BID   QUEtiapine  100 mg Oral QHS   QUEtiapine  25 mg Oral Daily   terbinafine   Topical BID   thiamine  100 mg Oral Daily   Continuous Infusions:   LOS: 11 days   Time spent= 15 mins    Latausha Flamm Arsenio Loader, MD [facility]  If 7PM-7AM, please contact night-coverage [email or web address] 06/12/2019, 10:23 AM

## 2019-06-13 NOTE — Progress Notes (Signed)
Occupational Therapy Treatment Patient Details Name: Michael CopasConnie Espinoza MRN: [id number] DOB: 01/31/1956 Today's Date: 06/13/2019    History of present illness 63 yo male with onset of AMS was admitted with hallucinations, dx acute metabolic encephalopathy from possibly too much medication.  Confused and has been previously restrained, now able to get up with Covid (-) status.  Cleared for MRI and CT (both negative) as well as EEG (normal re: neurology).  Psych consulted and recommending inpatient psych admit.  PMHx:  Xanax abuse, HTN, HLD, GERD, hypothyroidism, colitis, depression , R ankle surgery,    OT comments  Pt continues to need cues for attention and memory during multistep task.  Pt ambulating independently without cues for safety  Follow Up Recommendations  [facility]    Equipment Recommendations  None recommended by OT    Recommendations for Other Services      Precautions / Restrictions Precautions Precautions: Fall Restrictions Weight Bearing Restrictions: No       Mobility Bed Mobility                  Transfers Overall transfer level: Independent                    Balance                                           ADL either performed or assessed with clinical judgement   ADL                                         General ADL Comments: pt had completed all BADLs this am.  Performed 3 step task involving finding 3 locations on the unit, including finding his way back to his room.  He found all with min cues as he was in the area and didn't find first location and bypassed his room twice. Educated on using signage on unit.  Initially, had pt repeat 3 locations back to me, and he recalled 2/3.  Independent walking.  pt remains at supervision level for staying on task, and remembering all tasks to complete     Vision       Perception     Praxis      Cognition Arousal/Alertness: Awake/alert Behavior During  [facility] for tasks assessed/performed                                   General Comments: decreased memory, attention to task        Exercises     Shoulder Instructions       General Comments      Pertinent Vitals/ Pain       Pain Assessment: No/denies pain  Home Living                                          Prior Functioning/Environment              Frequency  Min 2X/week        Progress Toward Goals  OT Goals(current goals can now be found in the care plan section)  Progress towards  OT goals: Progressing toward goals  Acute Rehab OT Goals Patient Stated Goal: to walk  Plan      Co-evaluation                 AM-PAC OT "6 Clicks" Daily Activity     Outcome Measure   Help from another person eating meals?: None Help from another person taking care of personal grooming?: A Little Help from another person toileting, which includes using toliet, bedpan, or urinal?: A Little Help from another person bathing (including washing, rinsing, drying)?: A Little Help from another person to put on and taking off regular upper body clothing?: A Little Help from another person to put on and taking off regular lower body clothing?: A Little 6 Click Score: 19    End of Session    OT Visit Diagnosis: Cognitive communication deficit (R41.841)   Activity Tolerance Patient tolerated treatment well   Patient Left (in room with sitter)   Nurse Communication          Time: 8299-3716 OT Time Calculation (min): 8 min  Charges: OT General Charges $OT Visit: 1 Visit OT Treatments $Cognitive Funtion inital: Initial 15 mins  Lesle Chris, OTR/L Acute Rehabilitation Services [phone] [facility] pager [phone] office 06/13/2019   [place] 06/13/2019, 8:32 AM

## 2019-06-13 NOTE — Progress Notes (Signed)
Patient was discharged to [facility] by MD order; discharged instructions review and sent to facility with care notes; IV DIC; skin intact; patient will be escorted to the the facility by sheriff. Facility was called and report was given to the nurse who is going to receive the patient. Patient's wife was informed about patient's discharge.

## 2019-06-13 NOTE — Progress Notes (Signed)
PROGRESS NOTE    Michael Espinoza  UYQ:[id number] DOB: [birth year] DOA: 06/01/2019 PCP: Ramond Craver, PA   Brief Narrative:  63 y.o.malewith medical history significant offormer Xanax abuse, hypertension, hyperlipidemia, GERD, hypothyroidism, depression, colitis, who presented to OSH [facility]) with altered mental status, hallucinations and agitation on 05/29/2019.  Per report, patient had a history of Xanax addiction that was tapered and discontinued 2014, and has since been clean. Recently due to lower back pain, patient was seeing chiropractor. He has been taking Flexeril initially 10 mg 3 times per day, but the patient admitted that that he has beentaking up to 6 pills/day. Since then he was noted by his wife to be confused, with hallucination and agitation. Patient stopped taking Flexeril, but his mental status has not improved.No history of alcohol abuse, recreational drug or illicit drug use. No unilateral weakness, numbness in extremities. No facial droop or slurred speech. No chest pain, shortness breath, cough. No nausea vomiting, diarrhea, abdominal pain, symptoms of UTI. Pt was treated with prn zypexa and Geodonin another hospital.CT head andMRI brain hadno acute findings. Urine drug screen is negative.Patient had negative urinalysis, vitamin B 12 level 462 which is normal, negative COVID-19 test, negative chest x-ray. Due to lack of neurology and psychiatry services, patient was transferred to [facility]. Neurology was consulted. No further imaging studies were done here. All his benzodiazepines were held to wait for his body to clear up benzodiazepines. He has improved significantly since then. He is alert and oriented x4 however he continues to have visual as well as auditory hallucinations. He was evaluated by psychiatry and they recommended inpatient psychiatry admission.  There was concern that patient had tinea corporis therefore 7 days  of terbinafine was prescribed.  Transfer patient to inpatient psych when bed available.    Assessment & Plan:   Principal Problem:   Psychosis ([place]) Active Problems:   Hypertension   Hypothyroidism   HLD (hyperlipidemia)   GERD (gastroesophageal reflux disease)   Depression   Acute metabolic encephalopathy   Acute metabolic encephalopathy/hallucinations/Delusions. : Work-up in the hospital has been negative.  No SI/HI. CT of the head and MRI negative.  Seen by Psych.  Patient is having quite a bit of paranoia and hallucination.  No suicidal homicidal ideation.  Seroquel increased by psychiatry.  Appreciate neurology input.  Plans to discharge patient to [facility] today.  Essential hypertension:  Resume home meds  Hypothyroidism: Continue Synthroid.  Hyperlipidemia:continue lipitor  GERD: Pepcid as needed  Hypokalemia: Resolved.  Tinea corporis: He has 3 different lesions of tinea corporis in the right axilla and one on the right side of groin.Startedon terbinafine cream twice daily for 7 days. Last day 8/19   DVT prophylaxis: Lovenox Code Status: Full  Family Communication:  None at bedside  Disposition Plan: Transfer patient to inpatient psych-[facility]  Consultants:   Psych   Subjective: Patient is somewhat delusional otherwise does not have complaints.  Very pleasant and walking around in his room.  Review of Systems Otherwise negative except as per HPI, including: General = no fevers, chills, dizziness, malaise, fatigue HEENT/EYES = negative for pain, redness, loss of vision, double vision, blurred vision, loss of hearing, sore throat, hoarseness, dysphagia Cardiovascular= negative for chest pain, palpitation, murmurs, lower extremity swelling Respiratory/lungs= negative for shortness of breath, cough, hemoptysis, wheezing, mucus production Gastrointestinal= negative for nausea, vomiting,, abdominal pain, melena,  hematemesis Genitourinary= negative for Dysuria, Hematuria, Change in Urinary Frequency MSK = Negative for arthralgia,  myalgias, Back Pain, Joint swelling  Neurology= Negative for headache, seizures, numbness, tingling  Psychiatry= Negative for  suicidal and homocidal ideation Allergy/Immunology= Medication/Food allergy as listed  Skin= Negative for Rash, lesions, ulcers, itching   Objective: Vitals:   06/12/19 0503 06/12/19 1500 06/12/19 2056 06/13/19 0438  BP: 123/86 114/85 118/87 129/76  Pulse: 95 90 87 85  Resp: 16 18 16 16   Temp: 98 F (36.7 C) 98.1 F (36.7 C) 98 F (36.7 C) 97.7 F (36.5 C)  TempSrc: Oral Oral Oral Oral  SpO2: 98% 100% 100% 99%  Weight:      Height:        Intake/Output Summary (Last 24 hours) at 06/13/2019 1049 Last data filed at 06/13/2019 0900 Gross per 24 hour  Intake 200 ml  Output --  Net 200 ml   Filed Weights   06/01/19 1041 06/02/19 0352 06/06/19 0348  Weight: 65.8 kg 67 kg 61.2 kg    Examination:  Constitutional: NAD, calm, comfortable Eyes: PERRL, lids and conjunctivae normal ENMT: Mucous membranes are moist. Posterior pharynx clear of any exudate or lesions.Normal dentition.  Neck: normal, supple, no masses, no thyromegaly Respiratory: clear to auscultation bilaterally, no wheezing, no crackles. Normal respiratory effort. No accessory muscle use.  Cardiovascular: Regular rate and rhythm, no murmurs / rubs / gallops. No extremity edema. 2+ pedal pulses. No carotid bruits.  Abdomen: no tenderness, no masses palpated. No hepatosplenomegaly. Bowel sounds positive.  Musculoskeletal: no clubbing / cyanosis. No joint deformity upper and lower extremities. Good ROM, no contractures. Normal muscle tone.  Skin: no rashes, lesions, ulcers. No induration Neurologic: CN 2-12 grossly intact. Sensation intact, DTR normal. Strength 5/5 in all 4.  Psychiatric: Very delusional.  Poor judgment.  Cognition is somewhat impaired otherwise  pleasant.     Data Reviewed:   CBC: Recent Labs  Lab 06/11/19 0356  WBC 7.9  HGB 12.2*  HCT 36.3*  MCV 91.9  PLT 307   Basic Metabolic Panel: Recent Labs  Lab 06/07/19 0312 06/11/19 0356  NA 139 137  K 3.6 3.5  CL 102 101  CO2 25 26  GLUCOSE 107* 92  BUN 10 10  CREATININE 0.70 0.63  CALCIUM 10.3 9.3   GFR: Estimated Creatinine Clearance: 80.8 mL/min (by C-G formula based on SCr of 0.63 mg/dL). Liver Function Tests: No results for input(s): AST, ALT, ALKPHOS, BILITOT, PROT, ALBUMIN in the last 168 hours. No results for input(s): LIPASE, AMYLASE in the last 168 hours. No results for input(s): AMMONIA in the last 168 hours. Coagulation Profile: No results for input(s): INR, PROTIME in the last 168 hours. Cardiac Enzymes: No results for input(s): CKTOTAL, CKMB, CKMBINDEX, TROPONINI in the last 168 hours. BNP (last 3 results) No results for input(s): PROBNP in the last 8760 hours. HbA1C: No results for input(s): HGBA1C in the last 72 hours. CBG: Recent Labs  Lab 06/09/19 0729 06/09/19 1225 06/10/19 0751 06/10/19 1157 06/10/19 1603  GLUCAP 87 96 103* 98 107*   Lipid Profile: No results for input(s): CHOL, HDL, LDLCALC, TRIG, CHOLHDL, LDLDIRECT in the last 72 hours. Thyroid Function Tests: No results for input(s): TSH, T4TOTAL, FREET4, T3FREE, THYROIDAB in the last 72 hours. Anemia Panel: No results for input(s): VITAMINB12, FOLATE, FERRITIN, TIBC, IRON, RETICCTPCT in the last 72 hours. Sepsis Labs: No results for input(s): PROCALCITON, LATICACIDVEN in the last 168 hours.  No results found for this or any previous visit (from the past 240 hour(s)).       Radiology Studies: No  results found.      Scheduled Meds:  amLODipine  5 mg Oral Daily   aspirin EC  81 mg Oral Daily   atorvastatin  10 mg Oral Daily   cholestyramine  1 packet Oral Daily   enoxaparin (LOVENOX) injection  40 mg Subcutaneous Q24H   famotidine  20 mg Oral BID    feeding supplement (ENSURE ENLIVE)  237 mL Oral TID BM   levothyroxine  50 mcg Oral QAC breakfast   multivitamin with minerals  1 tablet Oral Daily   pantoprazole  40 mg Oral Daily   propranolol  10 mg Oral BID   QUEtiapine  100 mg Oral QHS   QUEtiapine  25 mg Oral Daily   terbinafine   Topical BID   thiamine  100 mg Oral Daily   Continuous Infusions:   LOS: 12 days   Time spent= 16 mins    Zair Borawski Joline Maxcyhirag Janeice Stegall, MD [facility]  If 7PM-7AM, please contact night-coverage [email or web address] 06/13/2019, 10:49 AM

## 2020-05-14 IMAGING — DX PORTABLE CHEST - 1 VIEW
1 series · 1 of 1 positions shown · non-contrast
Comparison: A 420

CLINICAL DATA: Altered mental status

EXAM:
PORTABLE CHEST 1 VIEW

[chest ap]
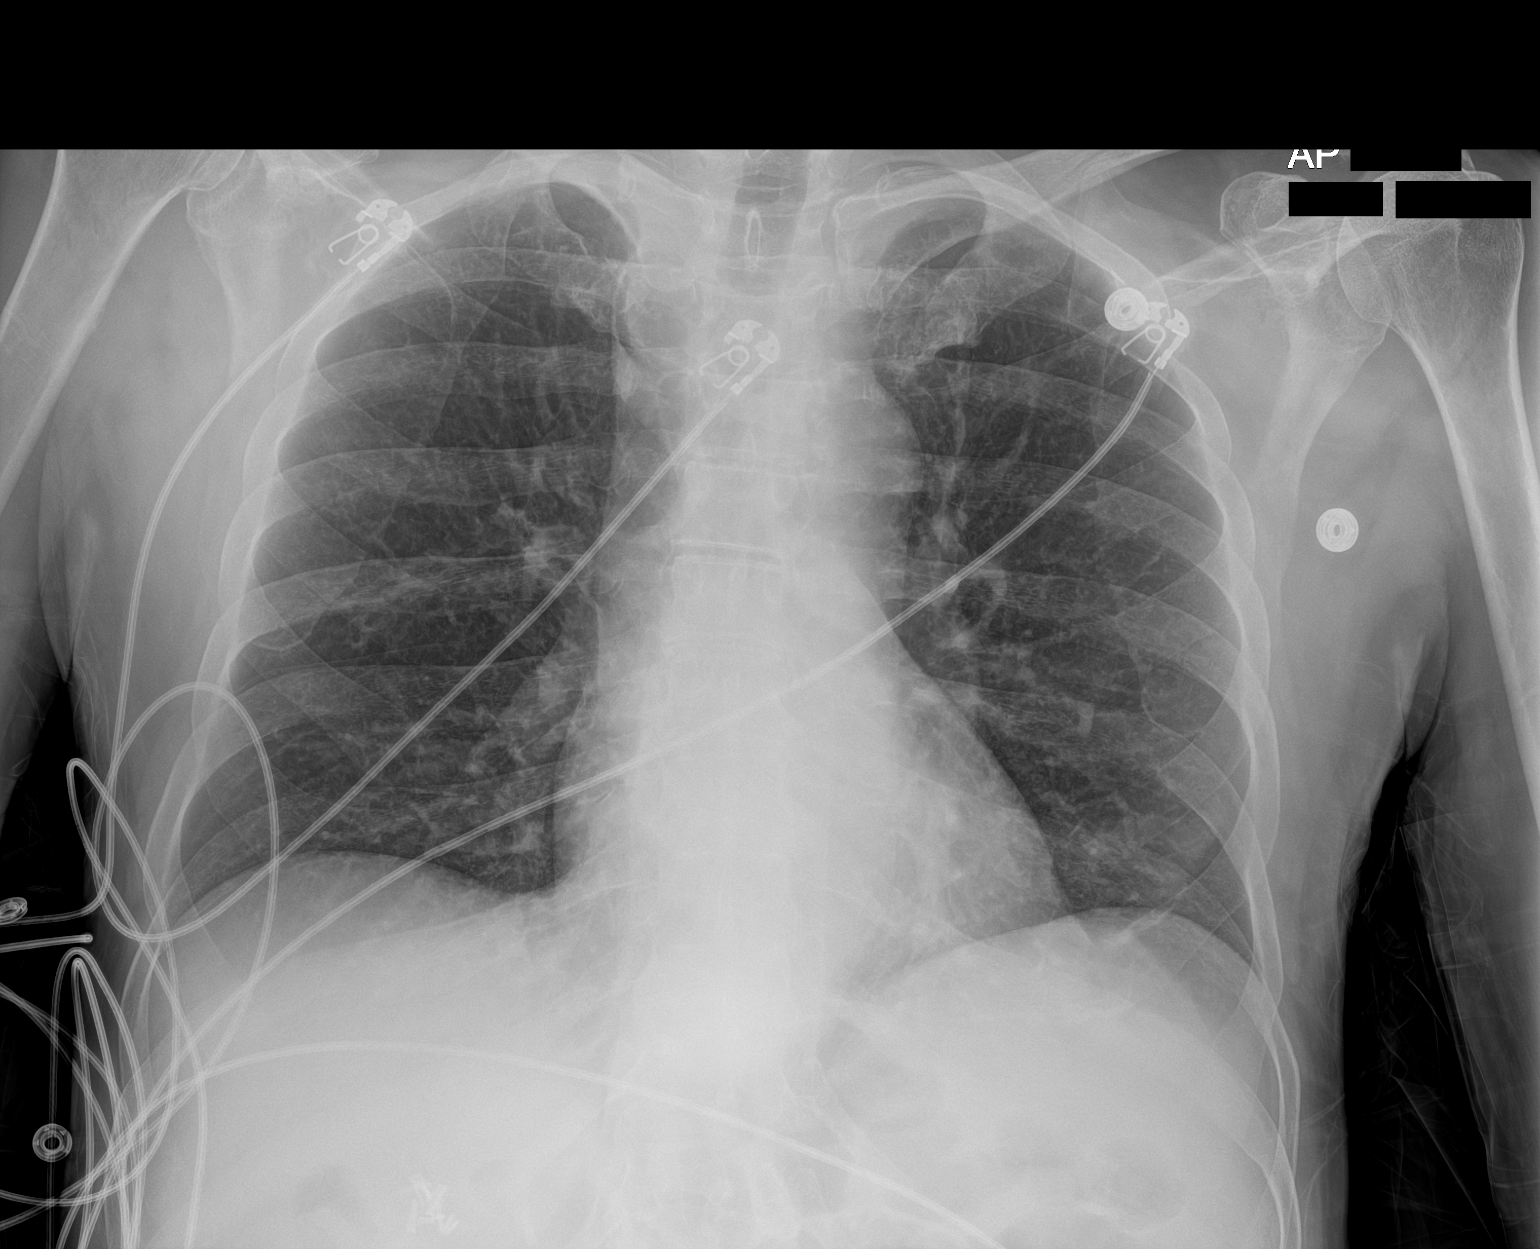

[1 of 1 positions shown; findings below may reference images not displayed]

FINDINGS: Midline trachea.  Normal heart size and mediastinal contours.

Sharp costophrenic angles.  No pneumothorax.  Clear lungs.

Numerous leads and wires project over the chest.
IMPRESSION: No active disease.

## 2022-03-29 LAB — COLOGUARD: COLOGUARD: NEGATIVE

## 2024-09-24 DEATH — deceased
